# Patient Record
Sex: Female | Born: 1955
Health system: Southern US, Community
[De-identification: ages and names within clinical notes are randomized; demographics above are authoritative.]

## PROBLEM LIST (undated history)

## (undated) DIAGNOSIS — K439 Ventral hernia without obstruction or gangrene: Secondary | ICD-10-CM

## (undated) DIAGNOSIS — F1210 Cannabis abuse, uncomplicated: Secondary | ICD-10-CM

## (undated) DIAGNOSIS — K8020 Calculus of gallbladder without cholecystitis without obstruction: Secondary | ICD-10-CM

## (undated) DIAGNOSIS — I10 Essential (primary) hypertension: Secondary | ICD-10-CM

## (undated) DIAGNOSIS — N182 Chronic kidney disease, stage 2 (mild): Secondary | ICD-10-CM

## (undated) HISTORY — PX: APPENDECTOMY: SHX54

---

## 2012-09-14 ENCOUNTER — Emergency Department (HOSPITAL_COMMUNITY): Payer: Self-pay

## 2012-09-14 ENCOUNTER — Encounter (HOSPITAL_COMMUNITY): Payer: Self-pay | Admitting: *Deleted

## 2012-09-14 ENCOUNTER — Emergency Department (HOSPITAL_COMMUNITY)
Admission: EM | Admit: 2012-09-14 | Discharge: 2012-09-14 | Disposition: A | Discharge status: Home / Self Care | Payer: Self-pay | Attending: Emergency Medicine | Admitting: Emergency Medicine

## 2012-09-14 DIAGNOSIS — L84 Corns and callosities: Secondary | ICD-10-CM | POA: Insufficient documentation

## 2012-09-14 DIAGNOSIS — K439 Ventral hernia without obstruction or gangrene: Secondary | ICD-10-CM | POA: Insufficient documentation

## 2012-09-14 DIAGNOSIS — K8020 Calculus of gallbladder without cholecystitis without obstruction: Secondary | ICD-10-CM | POA: Insufficient documentation

## 2012-09-14 DIAGNOSIS — F17200 Nicotine dependence, unspecified, uncomplicated: Secondary | ICD-10-CM | POA: Insufficient documentation

## 2012-09-14 LAB — URINALYSIS, ROUTINE W REFLEX MICROSCOPIC
Nitrite: NEGATIVE
Protein, ur: NEGATIVE mg/dL
Specific Gravity, Urine: 1.024 (ref 1.005–1.030)
Urobilinogen, UA: 1 mg/dL (ref 0.0–1.0)

## 2012-09-14 LAB — CBC WITH DIFFERENTIAL/PLATELET
Basophils Relative: 0 % (ref 0–1)
Eosinophils Absolute: 0.2 10*3/uL (ref 0.0–0.7)
Hemoglobin: 14.1 g/dL (ref 12.0–15.0)
MCH: 33.3 pg (ref 26.0–34.0)
MCHC: 33.8 g/dL (ref 30.0–36.0)
Monocytes Relative: 7 % (ref 3–12)
Neutro Abs: 3.2 10*3/uL (ref 1.7–7.7)
Neutrophils Relative %: 50 % (ref 43–77)
Platelets: 213 10*3/uL (ref 150–400)
RBC: 4.24 MIL/uL (ref 3.87–5.11)

## 2012-09-14 LAB — RAPID URINE DRUG SCREEN, HOSP PERFORMED
Cocaine: NOT DETECTED
Opiates: NOT DETECTED

## 2012-09-14 LAB — COMPREHENSIVE METABOLIC PANEL
ALT: 28 U/L (ref 0–35)
AST: 34 U/L (ref 0–37)
CO2: 26 mEq/L (ref 19–32)
Calcium: 9.3 mg/dL (ref 8.4–10.5)
Chloride: 104 mEq/L (ref 96–112)
Creatinine, Ser: 0.92 mg/dL (ref 0.50–1.10)
GFR calc Af Amer: 79 mL/min — ABNORMAL LOW (ref >90)
GFR calc non Af Amer: 68 mL/min — ABNORMAL LOW (ref >90)
Glucose, Bld: 82 mg/dL (ref 70–99)
Total Bilirubin: 0.4 mg/dL (ref 0.3–1.2)

## 2012-09-14 LAB — URINE MICROSCOPIC-ADD ON

## 2012-09-14 MED ORDER — HYDROCODONE-ACETAMINOPHEN 5-325 MG PO TABS: 1.0000 | ORAL_TABLET | Freq: Four times a day (QID) | ORAL | Status: DC | PRN

## 2012-09-14 MED ORDER — SODIUM CHLORIDE 0.9 % IV BOLUS (SEPSIS)
500.0000 mL | Freq: Once | INTRAVENOUS | Status: AC
  Administered 2012-09-14: 500 mL via INTRAVENOUS

## 2012-09-14 MED ORDER — SODIUM CHLORIDE 0.9 % IV SOLN
INTRAVENOUS | Status: DC
  Administered 2012-09-14: 16:00:00 via INTRAVENOUS

## 2012-09-14 MED ORDER — IOHEXOL 300 MG/ML  SOLN
100.0000 mL | Freq: Once | INTRAMUSCULAR | Status: AC | PRN
  Administered 2012-09-14: 100 mL via INTRAVENOUS

## 2012-09-14 NOTE — ED Provider Notes (Signed)
History     CSN: [id number]  Arrival date & time 09/14/12  1120   First MD Initiated Contact with Patient 09/14/12 1210      Chief Complaint  Patient presents with  . Abdominal Pain  . Wound Check    (Consider location/radiation/quality/duration/timing/severity/associated sxs/prior treatment) HPI Comments: Tracey Ramos is a 56 y.o. Female who is here at the insistence of her daughter, to be evaluated, for weight loss, and abdominal night, and a sore on her right heel. The patient notices not in her stomach, which is painful, when she walks to work. She is not having nausea, or vomiting. She has lost 20 or 30 pounds in the last several months. She also has alopecia. She denies pain chest, back, legs, or head. She has no paresthesias, weakness, nausea, vomiting. There's been no fever, or chills. There are no other aggravating factors. No palliative factors.  Patient is a 56 y.o. female presenting with abdominal pain and wound check. The history is provided by the patient.  Abdominal Pain The primary symptoms of the illness include abdominal pain.  Wound Check     History reviewed. No pertinent past medical history.  History reviewed. No pertinent past surgical history.  History reviewed. No pertinent family history.  History  Substance Use Topics  . Smoking status: Current Every Day Smoker    Types: Cigarettes  . Smokeless tobacco: Not on file  . Alcohol Use: Yes    OB History    Grav Para Term Preterm Abortions TAB SAB Ect Mult Living                  Review of Systems  Gastrointestinal: Positive for abdominal pain.  All other systems reviewed and are negative.    Allergies  Penicillins  Home Medications   Current Outpatient Rx  Name Route Sig Dispense Refill  . ADULT MULTIVITAMIN W/MINERALS CH Oral Take 1 tablet by mouth daily.    Marland Kitchen HYDROCODONE-ACETAMINOPHEN 5-325 MG PO TABS Oral Take 1 tablet by mouth every 6 (six) hours as needed for pain. 30 tablet 0      BP 154/80  Pulse 71  Temp 98.1 F (36.7 C) (Oral)  Resp 15  SpO2 100%  Physical Exam  Nursing note and vitals reviewed. Constitutional: She is oriented to person, place, and time. She appears well-developed. No distress.       She is undernourished.  HENT:  Head: Normocephalic and atraumatic.  Eyes: Conjunctivae normal and EOM are normal. Pupils are equal, round, and reactive to light.  Neck: Normal range of motion and phonation normal. Neck supple.  Cardiovascular: Normal rate, regular rhythm and intact distal pulses.   Pulmonary/Chest: Effort normal and breath sounds normal. She exhibits no tenderness.  Abdominal: Soft. She exhibits no distension. There is no tenderness. There is no guarding.       Reducible midline hernia above the umbilicus. It is not tender during this procedure.   Musculoskeletal: Normal range of motion.  Neurological: She is alert and oriented to person, place, and time. She has normal strength. She exhibits normal muscle tone.  Skin: Skin is warm and dry.       Callus right heel. No associated swelling, drainage, or fluctuance  Psychiatric: She has a normal mood and affect. Her behavior is normal. Judgment and thought content normal.    ED Course  Procedures (including critical care time)  Labs Reviewed  COMPREHENSIVE METABOLIC PANEL - Abnormal; Notable for the following:    GFR calc  non Af Amer 68 (*)     GFR calc Af Amer 79 (*)     All other components within normal limits  URINALYSIS, ROUTINE W REFLEX MICROSCOPIC - Abnormal; Notable for the following:    Leukocytes, UA MODERATE (*)     All other components within normal limits  URINE RAPID DRUG SCREEN (HOSP PERFORMED) - Abnormal; Notable for the following:    Tetrahydrocannabinol POSITIVE (*)     All other components within normal limits  CBC WITH DIFFERENTIAL  ETHANOL  URINE MICROSCOPIC-ADD ON  LAB REPORT - SCANNED   No results found.   1. Abdominal wall hernia   2. Cholelithiasis    3. Foot callus       MDM  Nonspecific abdominal pain, with hernia, consistent with a mass that she palpates. The hernia, is reducible, and not a surgical urgency at this time. She is incidental cholelithiasis without cholecystitis. Liver function tests are normal. The patient stable for discharge with outpatient management. Doubt metabolic instability, serious bacterial infection or impending vascular collapse; the patient is stable for discharge.            Flint Melter, MD 09/17/12 1415

## 2012-09-14 NOTE — ED Notes (Signed)
Pt to CT

## 2012-09-14 NOTE — ED Notes (Signed)
Pt states "I have a knot in my stomach that moves." pt denies pain but family member is concerned. Pt also reports hair falling out and a callous appearing wound to back of right heal.

## 2012-09-14 NOTE — ED Notes (Signed)
Pt gave urine sample if needed. 

## 2012-09-14 NOTE — ED Notes (Signed)
Pt has multiple complaints. Reports extreme hair loss within the last month, callous on her right heel, and knot on her abdomen. Knot appeared like a hernia with no pain right now but does have pain at time, and movable sometimes. Bowel sound present, alert, oriented.

## 2012-09-14 NOTE — ED Notes (Signed)
MD at bedside. 

## 2012-09-26 ENCOUNTER — Ambulatory Visit (INDEPENDENT_AMBULATORY_CARE_PROVIDER_SITE_OTHER): Payer: Self-pay | Admitting: General Surgery

## 2012-10-13 ENCOUNTER — Ambulatory Visit (INDEPENDENT_AMBULATORY_CARE_PROVIDER_SITE_OTHER): Payer: Self-pay | Admitting: General Surgery

## 2012-10-24 ENCOUNTER — Ambulatory Visit: Payer: Self-pay | Admitting: Family Medicine

## 2012-11-17 ENCOUNTER — Ambulatory Visit: Payer: Self-pay | Admitting: Family Medicine

## 2014-05-11 ENCOUNTER — Encounter (HOSPITAL_COMMUNITY): Payer: Self-pay | Admitting: Emergency Medicine

## 2014-05-11 ENCOUNTER — Emergency Department (HOSPITAL_COMMUNITY)
Admission: EM | Admit: 2014-05-11 | Discharge: 2014-05-12 | Disposition: A | Discharge status: Home / Self Care | Payer: Self-pay | Attending: Emergency Medicine | Admitting: Emergency Medicine

## 2014-05-11 ENCOUNTER — Emergency Department (HOSPITAL_COMMUNITY): Payer: Self-pay

## 2014-05-11 DIAGNOSIS — F17200 Nicotine dependence, unspecified, uncomplicated: Secondary | ICD-10-CM | POA: Insufficient documentation

## 2014-05-11 DIAGNOSIS — Z880 Allergy status to penicillin: Secondary | ICD-10-CM | POA: Insufficient documentation

## 2014-05-11 DIAGNOSIS — E876 Hypokalemia: Secondary | ICD-10-CM

## 2014-05-11 DIAGNOSIS — I4892 Unspecified atrial flutter: Secondary | ICD-10-CM | POA: Insufficient documentation

## 2014-05-11 DIAGNOSIS — Z8719 Personal history of other diseases of the digestive system: Secondary | ICD-10-CM | POA: Insufficient documentation

## 2014-05-11 DIAGNOSIS — R51 Headache: Secondary | ICD-10-CM | POA: Insufficient documentation

## 2014-05-11 DIAGNOSIS — R000 Tachycardia, unspecified: Secondary | ICD-10-CM | POA: Insufficient documentation

## 2014-05-11 DIAGNOSIS — R0602 Shortness of breath: Secondary | ICD-10-CM | POA: Insufficient documentation

## 2014-05-11 HISTORY — DX: Calculus of gallbladder without cholecystitis without obstruction: K80.20

## 2014-05-11 HISTORY — DX: Ventral hernia without obstruction or gangrene: K43.9

## 2014-05-11 LAB — CBC WITH DIFFERENTIAL/PLATELET
BASOS ABS: 0 10*3/uL (ref 0.0–0.1)
BASOS PCT: 0 % (ref 0–1)
EOS PCT: 0 % (ref 0–5)
Eosinophils Absolute: 0 10*3/uL (ref 0.0–0.7)
HEMATOCRIT: 41.1 % (ref 36.0–46.0)
Hemoglobin: 14.4 g/dL (ref 12.0–15.0)
Lymphocytes Relative: 24 % (ref 12–46)
Lymphs Abs: 3.3 10*3/uL (ref 0.7–4.0)
MCH: 33.1 pg (ref 26.0–34.0)
MCHC: 35 g/dL (ref 30.0–36.0)
MCV: 94.5 fL (ref 78.0–100.0)
MONO ABS: 1.4 10*3/uL — AB (ref 0.1–1.0)
MONOS PCT: 10 % (ref 3–12)
Neutro Abs: 9.1 10*3/uL — ABNORMAL HIGH (ref 1.7–7.7)
Neutrophils Relative %: 66 % (ref 43–77)
Platelets: 231 10*3/uL (ref 150–400)
RBC: 4.35 MIL/uL (ref 3.87–5.11)
RDW: 12.6 % (ref 11.5–15.5)
WBC: 13.8 10*3/uL — ABNORMAL HIGH (ref 4.0–10.5)

## 2014-05-11 LAB — RAPID URINE DRUG SCREEN, HOSP PERFORMED
Amphetamines: NOT DETECTED
BARBITURATES: NOT DETECTED
Benzodiazepines: NOT DETECTED
COCAINE: NOT DETECTED
Opiates: NOT DETECTED
TETRAHYDROCANNABINOL: NOT DETECTED

## 2014-05-11 LAB — COMPREHENSIVE METABOLIC PANEL
ALT: 13 U/L (ref 0–35)
AST: 26 U/L (ref 0–37)
Albumin: 5 g/dL (ref 3.5–5.2)
Alkaline Phosphatase: 83 U/L (ref 39–117)
BUN: 11 mg/dL (ref 6–23)
CALCIUM: 10.3 mg/dL (ref 8.4–10.5)
CO2: 19 meq/L (ref 19–32)
CREATININE: 1.33 mg/dL — AB (ref 0.50–1.10)
Chloride: 101 mEq/L (ref 96–112)
GFR calc Af Amer: 50 mL/min — ABNORMAL LOW (ref >90)
GFR, EST NON AFRICAN AMERICAN: 43 mL/min — AB (ref >90)
Glucose, Bld: 145 mg/dL — ABNORMAL HIGH (ref 70–99)
Potassium: 3 mEq/L — ABNORMAL LOW (ref 3.7–5.3)
Sodium: 142 mEq/L (ref 137–147)
Total Bilirubin: 0.9 mg/dL (ref 0.3–1.2)
Total Protein: 8.5 g/dL — ABNORMAL HIGH (ref 6.0–8.3)

## 2014-05-11 LAB — MAGNESIUM: MAGNESIUM: 2.1 mg/dL (ref 1.5–2.5)

## 2014-05-11 LAB — I-STAT TROPONIN, ED: TROPONIN I, POC: 0 ng/mL (ref 0.00–0.08)

## 2014-05-11 LAB — PHOSPHORUS: Phosphorus: 2.7 mg/dL (ref 2.3–4.6)

## 2014-05-11 LAB — PRO B NATRIURETIC PEPTIDE: PRO B NATRI PEPTIDE: 108.6 pg/mL (ref 0–125)

## 2014-05-11 MED ORDER — POTASSIUM CHLORIDE CRYS ER 20 MEQ PO TBCR
40.0000 meq | EXTENDED_RELEASE_TABLET | Freq: Once | ORAL | Status: AC
  Administered 2014-05-12: 40 meq via ORAL
  Filled 2014-05-11: qty 2

## 2014-05-11 MED ORDER — ADENOSINE 6 MG/2ML IV SOLN
6.0000 mg | Freq: Once | INTRAVENOUS | Status: DC
  Filled 2014-05-11: qty 2

## 2014-05-11 MED ORDER — ONDANSETRON HCL 4 MG/2ML IJ SOLN
INTRAMUSCULAR | Status: AC
  Administered 2014-05-11: 4 mg via INTRAVENOUS
  Filled 2014-05-11: qty 2

## 2014-05-11 MED ORDER — DILTIAZEM HCL 25 MG/5ML IV SOLN
15.0000 mg | Freq: Once | INTRAVENOUS | Status: AC
  Administered 2014-05-11: 15 mg via INTRAVENOUS
  Filled 2014-05-11: qty 5

## 2014-05-11 MED ORDER — ADENOSINE 6 MG/2ML IV SOLN
6.0000 mg | Freq: Once | INTRAVENOUS | Status: AC
  Administered 2014-05-11: 6 mg via INTRAVENOUS

## 2014-05-11 MED ORDER — DEXTROSE 5 % IV SOLN
5.0000 mg/h | INTRAVENOUS | Status: DC
  Administered 2014-05-11: 5 mg/h via INTRAVENOUS

## 2014-05-11 MED ORDER — ADENOSINE 6 MG/2ML IV SOLN: 18.0000 mg | Freq: Once | INTRAVENOUS | Status: DC

## 2014-05-11 MED ORDER — ONDANSETRON HCL 4 MG/2ML IJ SOLN
4.0000 mg | Freq: Once | INTRAMUSCULAR | Status: AC
  Administered 2014-05-11: 4 mg via INTRAVENOUS

## 2014-05-11 NOTE — ED Notes (Addendum)
Pt. presents with boyfriend , reports palpitations with headache onset this evening , pt. is a poor historian , respirations unlabored , no facial asymmetry/ equal grips , speech clear . Hypertensive at triage / HR = 170's .

## 2014-05-11 NOTE — ED Notes (Signed)
EKG repeated, NSR 94, [facility] alert, NAD, calm, (denies: sob, pain or other sx).

## 2014-05-11 NOTE — ED Provider Notes (Signed)
CSN: [id number]     Arrival date & time 05/11/14  2114 History   First MD Initiated Contact with Patient 05/11/14 2133     Chief Complaint  Patient presents with  . Palpitations  . Headache     (Consider location/radiation/quality/duration/timing/severity/associated sxs/prior Treatment) Patient is a 58 y.o. female presenting with palpitations and headaches. The history is provided by the patient and medical records. No language interpreter was used.  Palpitations Palpitations quality:  Fast Onset quality:  Sudden Timing:  Constant Progression:  Worsening Chronicity:  New Context: not anxiety, not appetite suppressants, not illicit drugs, not nicotine and not stimulant use   Relieved by:  Nothing Ineffective treatments:  Bed rest Associated symptoms: shortness of breath   Associated symptoms: no back pain, no chest pain, no leg pain, no numbness, no vomiting and no weakness   Risk factors: no diabetes mellitus, no heart disease, no hx of atrial fibrillation, no hx of DVT, no hx of PE, no hx of thyroid disease, no hyperthyroidism and no stress   Headache Associated symptoms: no back pain, no numbness, no vomiting and no weakness     Past Medical History  Diagnosis Date  . Cholelithiasis   . Hernia of abdominal wall    History reviewed. No pertinent past surgical history. No family history on file. History  Substance Use Topics  . Smoking status: Current Every Day Smoker    Types: Cigarettes  . Smokeless tobacco: Not on file  . Alcohol Use: Yes   OB History   Grav Para Term Preterm Abortions TAB SAB Ect Mult Living                 Review of Systems  Respiratory: Positive for shortness of breath.   Cardiovascular: Positive for palpitations. Negative for chest pain.  Gastrointestinal: Negative for vomiting.  Musculoskeletal: Negative for back pain.  Neurological: Positive for headaches. Negative for numbness.  All other systems reviewed and are  negative.     Allergies  Penicillins  Home Medications   Prior to Admission medications   Medication Sig Start Date End Date Taking? Authorizing Provider  HYDROcodone-acetaminophen (NORCO) 5-325 MG per tablet Take 1 tablet by mouth every 6 (six) hours as needed for pain. 09/14/12   Flint MelterElliott L Wentz, MD  Multiple Vitamin (MULTIVITAMIN WITH MINERALS) TABS Take 1 tablet by mouth daily.    Historical Provider, MD   BP 195/115  Pulse 174  Temp(Src) 98.5 F (36.9 C)  Resp 20  SpO2 100% Physical Exam  Nursing note and vitals reviewed. Constitutional: She is oriented to person, place, and time. She appears well-developed and well-nourished.  HENT:  Head: Normocephalic and atraumatic.  Right Ear: External ear normal.  Left Ear: External ear normal.  Nose: Nose normal.  Eyes: Conjunctivae are normal. Pupils are equal, round, and reactive to light.  Neck: Normal range of motion. Neck supple.  Cardiovascular: Regular rhythm.  Tachycardia present.   Pulmonary/Chest: Effort normal and breath sounds normal.  Abdominal: Soft. Bowel sounds are normal.  Musculoskeletal: Normal range of motion. She exhibits no edema and no tenderness.  Neurological: She is alert and oriented to person, place, and time. She has normal reflexes.  Skin: Skin is warm and dry.  Psychiatric: She has a normal mood and affect.    ED Course  Procedures (including critical care time) Labs Review Labs Reviewed  CBC WITH DIFFERENTIAL - Abnormal; Notable for the following:    WBC 13.8 (*)    Neutro Abs 9.1 (*)  Monocytes Absolute 1.4 (*)    All other components within normal limits  COMPREHENSIVE METABOLIC PANEL - Abnormal; Notable for the following:    Potassium 3.0 (*)    Glucose, Bld 145 (*)    Creatinine, Ser 1.33 (*)    Total Protein 8.5 (*)    GFR calc non Af Amer 43 (*)    GFR calc Af Amer 50 (*)    All other components within normal limits  MAGNESIUM  PHOSPHORUS  URINE RAPID DRUG SCREEN (HOSP  PERFORMED)  PRO B NATRIURETIC PEPTIDE  ETHANOL  I-STAT TROPOININ, ED    Imaging Review Dg Chest Portable 1 View  05/11/2014   CLINICAL DATA:  Palpitations  EXAM: PORTABLE CHEST - 1 VIEW  COMPARISON:  None.  FINDINGS: The lungs are adequately inflated. There are external pacemaker pads present. The cardiopericardial silhouette is normal in size. The pulmonary vascularity is not engorged. The mediastinum is normal in width. An electronic the circuit board type device is visible to the right of the lower thoracic spine. There is no pleural effusion. The observed portions of the bony thorax are normal.  IMPRESSION: There is no evidence of CHF or pneumonia nor other active cardiopulmonary disease.   Electronically Signed   By: David  SwazilandJordan   On: 05/11/2014 22:24     EKG Interpretation   Date/Time:  Friday May 11 2014 21:20:32 EDT Ventricular Rate:  175 PR Interval:  122 QRS Duration: 66 QT Interval:  280 QTC Calculation: 477 R Axis:   7 Text Interpretation:  Sinus tachycardia Marked ST abnormality, possible  inferior subendocardial injury Abnormal ECG narrow rhythym tachycardia -  aflutter vs st Abnormal ekg Confirmed by Gerhard MunchLOCKWOOD, ROBERT  MD [phone]([id number]) on  05/11/2014 9:40:06 PM        Date: 05/11/2014  Rate: 175  Rhythm: a flutter  QRS Axis: normal  Intervals: normal  ST/T Wave abnormalities: ST depressions laterally  Conduction Disutrbances:none  Narrative Interpretation:   Old EKG Reviewed: none available      Date: 05/12/2014  Rate: 97  Rhythm: normal sinus rhythm  QRS Axis: normal  Intervals: normal  ST/T Wave abnormalities: normal  Conduction Disutrbances:none  Narrative Interpretation:   Old EKG Reviewed: sinus rhythm has replaced atrial flutter    MDM   Final diagnoses:  Atrial flutter  Palpitation  SOB (shortness of breath)    Patient presents emergency Department with complaints of palpitations and shortness of breath. Upon arrival her vital signs  remarkable for heart rate in the 170s. EKG completed showed evidence of possible SVT versus a flutter. 6 mg of adenosine was given. Rhythm strip shows evidence of flutter waves. She was given diltiazem bolus and started on a drip. Workup for underlying cause remarkable for mild hypokalemia but unremarkable CXR, troponin, or UDS.  Patient given PO potassium and cardiology consulted for admission.  While on dilt gtt patient noted to be in sinus and EKG completed confirmed this.  Patient to be admitted for initiation of PO medications and monitoring.      Johnney Ouerek Zakaree Mcclenahan, MD 05/12/14 [phone]

## 2014-05-11 NOTE — ED Notes (Signed)
Drs Ronald LoboKunz and Jeraldine [facility]Lockwood [facility] No significant lasting effect from adenosine, slowed and returned to 134, cardizem ordered, pt tolerated adenosine well, remains alert, NAD, calm, interactive, resps e/u, some increased wob and sighing, CP resolved, denies dizziness or nausea, reports some sob.

## 2014-05-12 DIAGNOSIS — I495 Sick sinus syndrome: Secondary | ICD-10-CM

## 2014-05-12 LAB — URINALYSIS, ROUTINE W REFLEX MICROSCOPIC
Bilirubin Urine: NEGATIVE
GLUCOSE, UA: NEGATIVE mg/dL
Hgb urine dipstick: NEGATIVE
Ketones, ur: NEGATIVE mg/dL
Leukocytes, UA: NEGATIVE
NITRITE: NEGATIVE
Protein, ur: NEGATIVE mg/dL
SPECIFIC GRAVITY, URINE: 1.009 (ref 1.005–1.030)
Urobilinogen, UA: 0.2 mg/dL (ref 0.0–1.0)
pH: 7 (ref 5.0–8.0)

## 2014-05-12 LAB — ETHANOL

## 2014-05-12 NOTE — ED Notes (Signed)
Dr. Lavella LemonsManly into room to explain d/c and update on plan.

## 2014-05-12 NOTE — ED Notes (Signed)
[facility]iology[facility] changes.

## 2014-05-12 NOTE — Discharge Instructions (Signed)
Hypokalemia Hypokalemia means that the amount of potassium in the blood is lower than normal.Potassium is a chemical, called an electrolyte, that helps regulate the amount of fluid in the body. It also stimulates muscle contraction and helps nerves function properly.Most of the body's potassium is inside of cells, and only a very small amount is in the blood. Because the amount in the blood is so small, minor changes can be life-threatening. CAUSES  Antibiotics.  Diarrhea or vomiting.  Using laxatives too much, which can cause diarrhea.  Chronic kidney disease.  Water pills (diuretics).  Eating disorders (bulimia).  Low magnesium level.  Sweating a lot. SIGNS AND SYMPTOMS  Weakness.  Constipation.  Fatigue.  Muscle cramps.  Mental confusion.  Skipped heartbeats or irregular heartbeat (palpitations).  Tingling or numbness. DIAGNOSIS  Your health care provider can diagnose hypokalemia with blood tests. In addition to checking your potassium level, your health care provider may also check other lab tests. TREATMENT Hypokalemia can be treated with potassium supplements taken by mouth or adjustments in your current medicines. If your potassium level is very low, you may need to get potassium through a vein (IV) and be monitored in the hospital. A diet high in potassium is also helpful. Foods high in potassium are:  Nuts, such as peanuts and pistachios.  Seeds, such as sunflower seeds and pumpkin seeds.  Peas, lentils, and lima beans.  Whole grain and bran cereals and breads.  Fresh fruit and vegetables, such as apricots, avocado, bananas, cantaloupe, kiwi, oranges, tomatoes, asparagus, and potatoes.  Orange and tomato juices.  Red meats.  Fruit yogurt. HOME CARE INSTRUCTIONS  Take all medicines as prescribed by your health care provider.  Maintain a healthy diet by including nutritious food, such as fruits, vegetables, nuts, whole grains, and lean meats.  If  you are taking a laxative, be sure to follow the directions on the label. SEEK MEDICAL CARE IF:  Your weakness gets worse.  You feel your heart pounding or racing.  You are vomiting or having diarrhea.  You are diabetic and having trouble keeping your blood glucose in the normal range. SEEK IMMEDIATE MEDICAL CARE IF:  You have chest pain, shortness of breath, or dizziness.  You are vomiting or having diarrhea for more than 2 days.  You faint. MAKE SURE YOU:   Understand these instructions.  Will watch your condition.  Will get help right away if you are not doing well or get worse. Document Released: 12/14/2005 Document Revised: 10/04/2013 Document Reviewed: 06/16/2013 ExitCare Patient Information 2014 [employer].  

## 2014-05-12 NOTE — ED Notes (Signed)
[facility], pt updated, pending disposition, possible plan to d/c. No changes, VSS, denies pain or sx.

## 2014-05-12 NOTE — Consult Note (Signed)
History and Physical  Patient ID: Tracey Ramos MRN: [id number], SOB: 10/12/1956 58 y.o. Date of Encounter: 05/12/2014, 12:53 AM  Primary Physician: No primary provider on file. Primary Cardiologist: none  Chief Complaint: headache and palpitations  HPI: 58 y.o. female w/ no cardiac PMHx  who [facility] with complaints of headache and palpitations. Occurred after waking from a nap this evening. Had severe headache and felt her chest pounding. Felt lightheaded. No syncope. Denies ever having this before. Previously reports being healthy. No recent illness.   Denies illicit drugs. Occasional ETOH, none recently. Occasional tobacco. Denies caffeine. Denies OTC meds.   Difficult day at work- was outside all day, hot, graduation at [employer].  Upon arrival in triage, heart rates in the 140s to 170s, narrow complex. By ER, thought to be in SVT and given adenosine. Strip demonstrates complete AV block with sinus tach at ~140 with return of AV conduction in the 120s. Started on diltiazem gtt for presumed atrial flutter. On telemetry, gradual change from 140s to 90s.  First EKG appears to be sinus tach at 170. Follow up EKG with sinus, nl axis, no ST or TW changes. CXR was without acute cardiopulmonary abnormalities. Labs are significant for K of 3.0 and Cr. Of 1.3, WBC of 14.   Past Medical History  Diagnosis Date  . Cholelithiasis   . Hernia of abdominal wall      Surgical History: History reviewed. No pertinent past surgical history.   Home Meds: Prior to Admission medications   Medication Sig Start Date End Date Taking? Authorizing Provider  Multiple Vitamin (MULTIVITAMIN WITH MINERALS) TABS tablet Take 1 tablet by mouth daily.   Yes Historical Provider, MD  tetrahydrozoline 0.05 % ophthalmic solution Place 1 drop into both eyes daily as needed (for dry eyes).   Yes Historical Provider, MD    Allergies:  Allergies  Allergen Reactions  . Penicillins  Swelling    History   Social History  . Marital Status: Single    Spouse Name: N/A    Number of Children: N/A  . Years of Education: N/A   Occupational History  . Not on file.   Social History Main Topics  . Smoking status: Current Every Day Smoker    Types: Cigarettes  . Smokeless tobacco: Not on file  . Alcohol Use: Yes  . Drug Use: No  . Sexual Activity: Not on file   Other Topics Concern  . Not on file   Social History Narrative  . No narrative on file     No family history on file.  Review of Systems: General: negative for chills, fever, night sweats or weight changes.  Cardiovascular: see HPI Dermatological: negative for rash Respiratory: negative for cough or wheezing Urologic: negative for hematuria Abdominal: negative for nausea, vomiting, diarrhea, bright red blood per rectum, melena, or hematemesis Neurologic: negative for visual changes, syncope, or dizziness All other systems reviewed and are otherwise negative except as noted above.  Labs:   Lab Results  Component Value Date   WBC 13.8* 05/11/2014   HGB 14.4 05/11/2014   HCT 41.1 05/11/2014   MCV 94.5 05/11/2014   PLT 231 05/11/2014    Recent Labs Lab 05/11/14 2132  NA 142  K 3.0*  CL 101  CO2 19  BUN 11  CREATININE 1.33*  CALCIUM 10.3  PROT 8.5*  BILITOT 0.9  ALKPHOS 83  ALT 13  AST 26  GLUCOSE 145*   No results found for  this basename: CKTOTAL, CKMB, TROPONINI,  in the last 72 hours No results found for this basename: CHOL, HDL, LDLCALC, TRIG   No results found for this basename: DDIMER    Radiology/Studies:  Dg Chest Portable 1 View  05/11/2014   CLINICAL DATA:  Palpitations  EXAM: PORTABLE CHEST - 1 VIEW  COMPARISON:  None.  FINDINGS: The lungs are adequately inflated. There are external pacemaker pads present. The cardiopericardial silhouette is normal in size. The pulmonary vascularity is not engorged. The mediastinum is normal in width. An electronic the circuit board type  device is visible to the right of the lower thoracic spine. There is no pleural effusion. The observed portions of the bony thorax are normal.  IMPRESSION: There is no evidence of CHF or pneumonia nor other active cardiopulmonary disease.   Electronically Signed   By: David  Swazil[place]Jordan   On: 05/11/2014 22:24     EKG: see HPI  Physical Exam: Blood pressure 154/94, pulse 97, temperature 98.5 F (36.9 C), resp. rate 17, SpO2 99.00%. General: Well developed, well nourished, in no acute distress. Odd affect Head: Normocephalic, atraumatic, sclera non-icteric, nares are without discharge Neck: Supple. Negative for carotid bruits. JVD not elevated. Lungs: Clear bilaterally to auscultation without wheezes, rales, or rhonchi. Breathing is unlabored. Heart: RRR with S1 S2. No murmurs, rubs, or gallops appreciated. Abdomen: Soft, non-tender, non-distended with normoactive bowel sounds. No rebound/guarding. No obvious abdominal masses. Msk:  Strength and tone appear normal for age. Extremities: No edema. No clubbing or cyanosis. Distal pedal pulses are 2+ and equal bilaterally. Neuro: Alert and oriented X 3. Moves all extremities spontaneously. Psych:  Responds to questions appropriately though with odd affect.    ASSESSMENT AND PLAN:  1. Sinus tachycardia and palpitations 2. Headache 3. Elevated blood pressure  58 y.o. female w/ no cardiac PMHx  who [facility] with complaints of headache and palpitations. Found to be in fast narrow complex rhythm that was billed to me as atrial flutter. However, in reviewing the strips, response to adenosine and telemetry, her presentation is more consistent with sinus tachycardia and NOT atrial flutter. Cause of her sinus tachycardia is unclear though maybe related to headache, anxiety and pain. Negative troponin.  Noted elevated Cr, protein --> likely mild dehydration contributing to sinus tachycardia. Mildly elevated WBC, but  afebrile and not localizing.  Elevated BP --> needs PCP evaluation and followup.  Headache resolved.   No cardiac indications for admission.   Thank you for this consult. Please call with questions. No need for cardiac followup but emphasized that she needs a PCP.   Signed, Ardis RowanMatthew Rheanna Sergent MD 05/12/2014, 12:53 AM

## 2014-05-12 NOTE — ED Provider Notes (Signed)
Patient has been evaluated by Dr. Adolm JosephWhitlock of Cardiology. He has reviewed patient's EKGs and rhythm strips. The patient was treated with adenosine for suspected SVT which, appears to have successfully converted her to sinus rhythm - as demonstrated on EKGs/rythm strips. Dr. Adolm JosephWhitlock does not believe that the patient has any acute cardiac issues or evidence of dysrrythmia other than sinus tach and SVT. No signs of atrial flutter/fib.   Patient noticed to be hypokalemic on exam and this has been treated. Pt has received IVF. She is noted to have WBC of 13,000k. CXR wnl. I have ordered U/A to assess for occult UTI as possible cause of leukocytosis.   Brandt LoosenJulie Manly, MD 05/29/14 [phone]

## 2014-05-12 NOTE — ED Provider Notes (Signed)
This patient was seen in conjunction with the resident physician, Dr. Ronald LoboKunz.  That the patient on arrival to 2 tachycardia, dyspnea, mild lightheadedness. Clearly, the patient's tachycardia was evaluated, found to be regular, narrow complex, with concern for atrial flutter versus SVT.  On monitor: hr -140-190 - regular tachycardic, abnormal  O2- [place], 99% abormal  Following 6mg   The pause allowed visualization of p waves c/w flutter.  Patient was then started on Cardizem drip with decrease in HR, and eventual conversion to SR.  I have seen the ECG (1 and 2) and agree with the interpretation.    CRITICAL CARE Performed by: Gerhard Munchobert Brae Gartman Total critical care time: 35 Critical care time was exclusive of separately billable procedures and treating other patients. Critical care was necessary to treat or prevent imminent or life-threatening deterioration. Critical care was time spent personally by me on the following activities: development of treatment plan with patient and/or surrogate as well as nursing, discussions with consultants, evaluation of patient's response to treatment, examination of patient, obtaining history from patient or surrogate, ordering and performing treatments and interventions, ordering and review of laboratory studies, ordering and review of radiographic studies, pulse oximetry and re-evaluation of patient's condition.   Gerhard Munchobert Jermane Brayboy, MD 05/12/14 [phone]

## 2016-05-26 ENCOUNTER — Emergency Department (HOSPITAL_COMMUNITY)
Admission: EM | Admit: 2016-05-26 | Discharge: 2016-05-26 | Disposition: A | Discharge status: Home / Self Care | Payer: Self-pay | Attending: Emergency Medicine | Admitting: Emergency Medicine

## 2016-05-26 ENCOUNTER — Emergency Department (HOSPITAL_COMMUNITY): Payer: Self-pay

## 2016-05-26 ENCOUNTER — Encounter (HOSPITAL_COMMUNITY): Payer: Self-pay

## 2016-05-26 DIAGNOSIS — Z87891 Personal history of nicotine dependence: Secondary | ICD-10-CM | POA: Insufficient documentation

## 2016-05-26 DIAGNOSIS — R000 Tachycardia, unspecified: Secondary | ICD-10-CM | POA: Insufficient documentation

## 2016-05-26 DIAGNOSIS — Z8719 Personal history of other diseases of the digestive system: Secondary | ICD-10-CM | POA: Insufficient documentation

## 2016-05-26 DIAGNOSIS — R002 Palpitations: Secondary | ICD-10-CM | POA: Insufficient documentation

## 2016-05-26 DIAGNOSIS — Z880 Allergy status to penicillin: Secondary | ICD-10-CM | POA: Insufficient documentation

## 2016-05-26 DIAGNOSIS — I10 Essential (primary) hypertension: Secondary | ICD-10-CM | POA: Insufficient documentation

## 2016-05-26 DIAGNOSIS — R0602 Shortness of breath: Secondary | ICD-10-CM | POA: Insufficient documentation

## 2016-05-26 HISTORY — DX: Essential (primary) hypertension: I10

## 2016-05-26 LAB — COMPREHENSIVE METABOLIC PANEL
ALT: 18 U/L (ref 14–54)
AST: 25 U/L (ref 15–41)
Albumin: 4.1 g/dL (ref 3.5–5.0)
Alkaline Phosphatase: 63 U/L (ref 38–126)
Anion gap: 10 (ref 5–15)
BILIRUBIN TOTAL: 1.1 mg/dL (ref 0.3–1.2)
BUN: 13 mg/dL (ref 6–20)
CHLORIDE: 106 mmol/L (ref 101–111)
CO2: 23 mmol/L (ref 22–32)
CREATININE: 1.1 mg/dL — AB (ref 0.44–1.00)
Calcium: 9.2 mg/dL (ref 8.9–10.3)
GFR, EST NON AFRICAN AMERICAN: 53 mL/min — AB (ref >60)
Glucose, Bld: 87 mg/dL (ref 65–99)
POTASSIUM: 3.7 mmol/L (ref 3.5–5.1)
Sodium: 139 mmol/L (ref 135–145)
TOTAL PROTEIN: 7.4 g/dL (ref 6.5–8.1)

## 2016-05-26 LAB — I-STAT TROPONIN, ED
TROPONIN I, POC: 0.01 ng/mL (ref 0.00–0.08)
Troponin i, poc: 0.01 ng/mL (ref 0.00–0.08)

## 2016-05-26 LAB — CBC
HEMATOCRIT: 40.9 % (ref 36.0–46.0)
HEMOGLOBIN: 13.5 g/dL (ref 12.0–15.0)
MCH: 30.7 pg (ref 26.0–34.0)
MCHC: 33 g/dL (ref 30.0–36.0)
MCV: 93 fL (ref 78.0–100.0)
Platelets: 210 10*3/uL (ref 150–400)
RBC: 4.4 MIL/uL (ref 3.87–5.11)
RDW: 13 % (ref 11.5–15.5)
WBC: 11.3 10*3/uL — ABNORMAL HIGH (ref 4.0–10.5)

## 2016-05-26 LAB — TSH: TSH: 1.908 u[IU]/mL (ref 0.350–4.500)

## 2016-05-26 LAB — D-DIMER, QUANTITATIVE (NOT AT ARMC): D-Dimer, Quant: 0.27 ug/mL-FEU (ref 0.00–0.50)

## 2016-05-26 MED ORDER — LORAZEPAM 2 MG/ML IJ SOLN
1.0000 mg | Freq: Once | INTRAMUSCULAR | Status: AC
  Administered 2016-05-26: 1 mg via INTRAVENOUS
  Filled 2016-05-26: qty 1

## 2016-05-26 MED ORDER — SODIUM CHLORIDE 0.9 % IV BOLUS (SEPSIS)
1000.0000 mL | Freq: Once | INTRAVENOUS | Status: AC
Start: 2016-05-26 — End: 2016-05-26
  Administered 2016-05-26: 1000 mL via INTRAVENOUS

## 2016-05-26 NOTE — ED Notes (Signed)
Pt transported to xray 

## 2016-05-26 NOTE — ED Notes (Signed)
Patient began having central chest pain that woke her up out of her sleep just prior to arriving at the ED. Pt admits to having shortness of breath and dizziness. Denies nausea and vomiting. Pt is A&Ox 4 in no apparent distress

## 2016-05-26 NOTE — ED Notes (Signed)
Pt placed back on monitor via bp, pulse oximetry, and 5-lead upon returning from x-ray

## 2016-05-26 NOTE — ED Provider Notes (Signed)
CSN: [id number]     Arrival date & time 05/26/16  0650 History   First MD Initiated Contact with Patient 05/26/16 0700     Chief Complaint  Patient presents with  . Chest Pain     (Consider location/radiation/quality/duration/timing/severity/associated sxs/prior Treatment) Patient is a 60 y.o. female presenting with chest pain.  Chest Pain Pain location:  L chest Pain quality: pressure, sharp and shooting   Pain radiates to:  Does not radiate Pain radiates to the back: no   Pain severity:  Moderate Onset quality:  Sudden Duration:  5 hours Timing:  Constant Progression:  Unchanged Chronicity:  Recurrent Relieved by:  Nothing Worsened by:  Nothing tried Ineffective treatments:  None tried Associated symptoms: palpitations and shortness of breath   Associated symptoms: no dizziness, no fever, no headache, no nausea and not vomiting     60 yo F With a chief complaint chest pain. This will grow from sleep. Pain is pinpoint does not radiate. Feels like a pressure. Associated with palpitations and shortness of breath. Nonexertional. Patient has had symptoms like this in the past. She is feeling quite a bit anxious. Denies heat sensitivity or weight loss. Denies fevers or chills cough or congestion. Denies PE risk factors.  Past Medical History  Diagnosis Date  . Cholelithiasis   . Hernia of abdominal wall   . Hypertension    History reviewed. No pertinent past surgical history. No family history on file. Social History  Substance Use Topics  . Smoking status: Former Smoker    Types: Cigarettes  . Smokeless tobacco: None  . Alcohol Use: Yes   OB History    No data available     Review of Systems  Constitutional: Negative for fever and chills.  HENT: Negative for congestion and rhinorrhea.   Eyes: Negative for redness and visual disturbance.  Respiratory: Positive for shortness of breath. Negative for wheezing.   Cardiovascular: Positive for chest pain and palpitations.   Gastrointestinal: Negative for nausea and vomiting.  Genitourinary: Negative for dysuria and urgency.  Musculoskeletal: Negative for myalgias and arthralgias.  Skin: Negative for pallor and wound.  Neurological: Negative for dizziness and headaches.      Allergies  Penicillins  Home Medications   Prior to Admission medications   Medication Sig Start Date End Date Taking? Authorizing Provider  tetrahydrozoline 0.05 % ophthalmic solution Place 1 drop into both eyes daily as needed (for dry eyes).   Yes Historical Provider, MD   BP 162/103 mmHg  Pulse 108  Temp(Src) 98.2 F (36.8 C) (Oral)  Resp 16  SpO2 100% Physical Exam  Constitutional: She is oriented to person, place, and time. She appears well-developed and well-nourished. No distress.  HENT:  Head: Normocephalic and atraumatic.  Eyes: EOM are normal. Pupils are equal, round, and reactive to light.  Neck: Normal range of motion. Neck supple.  Cardiovascular: Regular rhythm.  Tachycardia present.  Exam reveals no gallop and no friction rub.   No murmur heard. Pulmonary/Chest: Effort normal. She has no wheezes. She has no rales.  Abdominal: Soft. She exhibits no distension. There is no tenderness. There is no rebound and no guarding.  Musculoskeletal: She exhibits no edema or tenderness.  Neurological: She is alert and oriented to person, place, and time.  Skin: Skin is warm and dry. She is not diaphoretic.  Psychiatric: She has a normal mood and affect. Her behavior is normal.  Nursing note and vitals reviewed.   ED Course  Procedures (including critical care  time) Labs Review Labs Reviewed  CBC - Abnormal; Notable for the following:    WBC 11.3 (*)    All other components within normal limits  COMPREHENSIVE METABOLIC PANEL - Abnormal; Notable for the following:    Creatinine, Ser 1.10 (*)    GFR calc non Af Amer 53 (*)    All other components within normal limits  TSH  D-DIMER, QUANTITATIVE (NOT AT [facility])   I-STAT TROPOININ, ED  Rosezena SensorI-STAT TROPOININ, ED    Imaging Review Dg Chest 2 View  05/26/2016  CLINICAL DATA:  Woke up with shortness of breath this morning. EXAM: CHEST  2 VIEW COMPARISON:  05/11/2014 FINDINGS: Mild peribronchial thickening and prominent lung markings at the lung bases. These findings may represent chronic changes. There is no evidence for pulmonary edema. No large areas of airspace disease. Heart size is within normal limits. Stable soft tissue density in the right paratracheal region is probably vascular in etiology. No large pleural effusions. No acute bone abnormality. IMPRESSION: Few prominent lung markings at the bases could represent chronic changes. Otherwise, no acute chest findings. Electronically Signed   By: Richarda OverlieAdam  Henn M.D.   On: 05/26/2016 07:50   I have personally reviewed and evaluated these images and lab results as part of my medical decision-making.   EKG Interpretation   Date/Time:  Tuesday May 26 2016 06:55:28 EDT Ventricular Rate:  115 PR Interval:  133 QRS Duration: 77 QT Interval:  315 QTC Calculation: 436 R Axis:   33 Text Interpretation:  Sinus tachycardia LAE, consider biatrial enlargement  Baseline wander in lead(s) V2 No significant change since last tracing  Confirmed by Jawana Reagor MD, Reuel BoomANIEL [id number]) on 05/26/2016 7:00:52 AM Also  confirmed by Adela LankFLOYD MD, DANIEL [phone]), editor Whitney PostLOGAN, Cala BradfordKIMBERLY [phone])  on  05/26/2016 8:43:31 AM      MDM   Final diagnoses:  Sinus tachycardia (HCC)    60 yo F with a chief complaint of pinpoint chest pain and shortness of breath. This happened suddenly this morning and woke her from sleep. Patient is in a sinus tachycardia that rapidly increases and decreases. Patient denies any illegal drug use. Symptoms improved with IV fluids and Ativan. We'll obtain a d-dimer delta troponin. Ddimer and initial trop negative.   Patient reassessed and feeling much better. After ambulating the patient's heart rate went back into  the low 100s. Repeat EKG with a sinus arrhythmia. We'll have the patient follow-up with her family doctor. Have her return for any sudden worsening of her symptoms.  11:02 AM:  I have discussed the diagnosis/risks/treatment options with the patient and believe the pt to be eligible for discharge home to follow-up with PCP. We also discussed returning to the ED immediately if new or worsening sx occur. We discussed the sx which are most concerning (e.g., sudden worsening chest pain, sob, palpitations) that necessitate immediate return. Medications administered to the patient during their visit and any new prescriptions provided to the patient are listed below.  Medications given during this visit Medications  LORazepam (ATIVAN) injection 1 mg (1 mg Intravenous Given 05/26/16 0752)  sodium chloride 0.9 % bolus 1,000 mL (0 mLs Intravenous Stopped 05/26/16 1042)    New Prescriptions   No medications on file    The patient appears reasonably screen and/or stabilized for discharge and I doubt any other medical condition or other [facility] requiring further screening, evaluation, or treatment in the ED at this time prior to discharge.    Melene Planan Henry Utsey, DO 05/26/16 1102

## 2016-05-26 NOTE — Discharge Instructions (Signed)
Follow-up with your family doctor. Return for sudden worsening of symptoms. Nonspecific Tachycardia Tachycardia is a faster than normal heartbeat (more than 100 beats per minute). In adults, the heart normally beats between 60 and 100 times a minute. A fast heartbeat may be a normal response to exercise or stress. It does not necessarily mean that something is wrong. However, sometimes when your heart beats too fast it may not be able to pump enough blood to the rest of your body. This can result in chest pain, shortness of breath, dizziness, and even fainting. Nonspecific tachycardia means that the specific cause or pattern of your tachycardia is unknown. CAUSES  Tachycardia may be harmless or it may be due to a more serious underlying cause. Possible causes of tachycardia include:  Exercise or exertion.  Fever.  Pain or injury.  Infection.  Loss of body fluids (dehydration).  Overactive thyroid.  Lack of red blood cells (anemia).  Anxiety and stress.  Alcohol.  Caffeine.  Tobacco products.  Diet pills.  Illegal drugs.  Heart disease. SYMPTOMS  Rapid or irregular heartbeat (palpitations).  Suddenly feeling your heart beating (cardiac awareness).  Dizziness.  Tiredness (fatigue).  Shortness of breath.  Chest pain.  Nausea.  Fainting. DIAGNOSIS  Your caregiver will perform a physical exam and take your medical history. In some cases, a heart specialist (cardiologist) may be consulted. Your caregiver may also order:  Blood tests.  Electrocardiography. This test records the electrical activity of your heart.  A heart monitoring test. TREATMENT  Treatment will depend on the likely cause of your tachycardia. The goal is to treat the underlying cause of your tachycardia. Treatment methods may include:  Replacement of fluids or blood through an intravenous (IV) tube for moderate to severe dehydration or anemia.  New medicines or changes in your current  medicines.  Diet and lifestyle changes.  Treatment for certain infections.  Stress relief or relaxation methods. HOME CARE INSTRUCTIONS   Rest.  Drink enough fluids to keep your urine clear or pale yellow.  Do not smoke.  Avoid:  Caffeine.  Tobacco.  Alcohol.  Chocolate.  Stimulants such as over-the-counter diet pills or pills that help you stay awake.  Situations that cause anxiety or stress.  Illegal drugs such as marijuana, phencyclidine (PCP), and cocaine.  Only take medicine as directed by your caregiver.  Keep all follow-up appointments as directed by your caregiver. SEEK IMMEDIATE MEDICAL CARE IF:   You have pain in your chest, upper arms, jaw, or neck.  You become weak, dizzy, or feel faint.  You have palpitations that will not go away.  You vomit, have diarrhea, or pass blood in your stool.  Your skin is cool, pale, and wet.  You have a fever that will not go away with rest, fluids, and medicine. MAKE SURE YOU:   Understand these instructions.  Will watch your condition.  Will get help right away if you are not doing well or get worse.   This information is not intended to replace advice given to you by your health care provider. Make sure you discuss any questions you have with your health care provider.   Document Released: 01/21/2005 Document Revised: 03/07/2012 Document Reviewed: 06/28/2015 Elsevier Interactive Patient Education [employer]2016 [employer]

## 2016-05-26 NOTE — ED Notes (Signed)
Pt ambulated to and from restroom; upon pt's return from restroom, this tech placed the pt back on the monitor via bp, pulse oximetry, and 5 lead

## 2018-03-06 ENCOUNTER — Encounter (HOSPITAL_COMMUNITY): Payer: Self-pay | Admitting: Emergency Medicine

## 2018-03-06 ENCOUNTER — Other Ambulatory Visit: Payer: Self-pay

## 2018-03-06 ENCOUNTER — Inpatient Hospital Stay (HOSPITAL_COMMUNITY)
Admission: EM | Admit: 2018-03-06 | Discharge: 2018-03-09 | DRG: 312 | Disposition: A | Discharge status: Home / Self Care | Payer: Self-pay | Attending: Internal Medicine | Admitting: Internal Medicine

## 2018-03-06 DIAGNOSIS — G9341 Metabolic encephalopathy: Secondary | ICD-10-CM | POA: Diagnosis present

## 2018-03-06 DIAGNOSIS — R55 Syncope and collapse: Principal | ICD-10-CM | POA: Diagnosis present

## 2018-03-06 DIAGNOSIS — N390 Urinary tract infection, site not specified: Secondary | ICD-10-CM | POA: Diagnosis present

## 2018-03-06 DIAGNOSIS — Z87891 Personal history of nicotine dependence: Secondary | ICD-10-CM

## 2018-03-06 DIAGNOSIS — I159 Secondary hypertension, unspecified: Secondary | ICD-10-CM

## 2018-03-06 DIAGNOSIS — E860 Dehydration: Secondary | ICD-10-CM | POA: Diagnosis present

## 2018-03-06 DIAGNOSIS — I129 Hypertensive chronic kidney disease with stage 1 through stage 4 chronic kidney disease, or unspecified chronic kidney disease: Secondary | ICD-10-CM | POA: Diagnosis present

## 2018-03-06 DIAGNOSIS — E876 Hypokalemia: Secondary | ICD-10-CM | POA: Diagnosis present

## 2018-03-06 DIAGNOSIS — I10 Essential (primary) hypertension: Secondary | ICD-10-CM | POA: Diagnosis present

## 2018-03-06 DIAGNOSIS — Z9114 Patient's other noncompliance with medication regimen: Secondary | ICD-10-CM

## 2018-03-06 DIAGNOSIS — I160 Hypertensive urgency: Secondary | ICD-10-CM | POA: Diagnosis present

## 2018-03-06 DIAGNOSIS — R000 Tachycardia, unspecified: Secondary | ICD-10-CM | POA: Diagnosis present

## 2018-03-06 DIAGNOSIS — Z880 Allergy status to penicillin: Secondary | ICD-10-CM

## 2018-03-06 DIAGNOSIS — I951 Orthostatic hypotension: Secondary | ICD-10-CM | POA: Diagnosis present

## 2018-03-06 DIAGNOSIS — N3000 Acute cystitis without hematuria: Secondary | ICD-10-CM | POA: Insufficient documentation

## 2018-03-06 DIAGNOSIS — N182 Chronic kidney disease, stage 2 (mild): Secondary | ICD-10-CM | POA: Diagnosis present

## 2018-03-06 DIAGNOSIS — N179 Acute kidney failure, unspecified: Secondary | ICD-10-CM | POA: Diagnosis present

## 2018-03-06 HISTORY — DX: Chronic kidney disease, stage 2 (mild): N18.2

## 2018-03-06 LAB — COMPREHENSIVE METABOLIC PANEL
ALBUMIN: 4.5 g/dL (ref 3.5–5.0)
ALT: 32 U/L (ref 14–54)
AST: 52 U/L — AB (ref 15–41)
Alkaline Phosphatase: 83 U/L (ref 38–126)
Anion gap: 13 (ref 5–15)
BUN: 19 mg/dL (ref 6–20)
CHLORIDE: 101 mmol/L (ref 101–111)
CO2: 22 mmol/L (ref 22–32)
CREATININE: 2.2 mg/dL — AB (ref 0.44–1.00)
Calcium: 9.4 mg/dL (ref 8.9–10.3)
GFR calc Af Amer: 27 mL/min — ABNORMAL LOW (ref >60)
GFR calc non Af Amer: 23 mL/min — ABNORMAL LOW (ref >60)
GLUCOSE: 113 mg/dL — AB (ref 65–99)
POTASSIUM: 3.3 mmol/L — AB (ref 3.5–5.1)
Sodium: 136 mmol/L (ref 135–145)
Total Bilirubin: 1 mg/dL (ref 0.3–1.2)
Total Protein: 8.3 g/dL — ABNORMAL HIGH (ref 6.5–8.1)

## 2018-03-06 LAB — URINALYSIS, ROUTINE W REFLEX MICROSCOPIC
Bilirubin Urine: NEGATIVE
Glucose, UA: NEGATIVE mg/dL
KETONES UR: 20 mg/dL — AB
Nitrite: NEGATIVE
PROTEIN: 30 mg/dL — AB
Specific Gravity, Urine: 1.019 (ref 1.005–1.030)
pH: 5 (ref 5.0–8.0)

## 2018-03-06 LAB — CBC
HCT: 43 % (ref 36.0–46.0)
HEMOGLOBIN: 14.1 g/dL (ref 12.0–15.0)
MCH: 31 pg (ref 26.0–34.0)
MCHC: 32.8 g/dL (ref 30.0–36.0)
MCV: 94.5 fL (ref 78.0–100.0)
PLATELETS: 268 10*3/uL (ref 150–400)
RBC: 4.55 MIL/uL (ref 3.87–5.11)
RDW: 13.2 % (ref 11.5–15.5)
WBC: 10.8 10*3/uL — ABNORMAL HIGH (ref 4.0–10.5)

## 2018-03-06 LAB — I-STAT CG4 LACTIC ACID, ED: Lactic Acid, Venous: 1.81 mmol/L (ref 0.5–1.9)

## 2018-03-06 LAB — TSH: TSH: 1.326 u[IU]/mL (ref 0.350–4.500)

## 2018-03-06 MED ORDER — SODIUM CHLORIDE 0.9 % IV BOLUS (SEPSIS)
1500.0000 mL | Freq: Once | INTRAVENOUS | Status: AC
  Administered 2018-03-06: 1500 mL via INTRAVENOUS

## 2018-03-06 MED ORDER — SODIUM CHLORIDE 0.9 % IV SOLN
1.0000 g | Freq: Once | INTRAVENOUS | Status: AC
  Administered 2018-03-06: 1 g via INTRAVENOUS
  Filled 2018-03-06: qty 10

## 2018-03-06 MED ORDER — ZOLPIDEM TARTRATE 5 MG PO TABS
5.0000 mg | ORAL_TABLET | Freq: Every evening | ORAL | Status: DC | PRN
  Administered 2018-03-07: 5 mg via ORAL
  Filled 2018-03-06: qty 1

## 2018-03-06 MED ORDER — POTASSIUM CHLORIDE 20 MEQ/15ML (10%) PO SOLN
40.0000 meq | Freq: Once | ORAL | Status: AC
  Administered 2018-03-06: 40 meq via ORAL
  Filled 2018-03-06: qty 30

## 2018-03-06 MED ORDER — HYDRALAZINE HCL 20 MG/ML IJ SOLN: 10.0000 mg | Freq: Once | INTRAMUSCULAR | Status: DC

## 2018-03-06 MED ORDER — HYDRALAZINE HCL 20 MG/ML IJ SOLN: 5.0000 mg | INTRAMUSCULAR | Status: DC | PRN

## 2018-03-06 MED ORDER — SODIUM CHLORIDE 0.9 % IV BOLUS (SEPSIS)
500.0000 mL | Freq: Once | INTRAVENOUS | Status: AC
  Administered 2018-03-06: 500 mL via INTRAVENOUS

## 2018-03-06 MED ORDER — ACETAMINOPHEN 325 MG PO TABS: 650.0000 mg | ORAL_TABLET | Freq: Four times a day (QID) | ORAL | Status: DC | PRN

## 2018-03-06 MED ORDER — SODIUM CHLORIDE 0.9 % IV BOLUS (SEPSIS)
1000.0000 mL | Freq: Once | INTRAVENOUS | Status: AC
  Administered 2018-03-06: 1000 mL via INTRAVENOUS

## 2018-03-06 MED ORDER — SODIUM CHLORIDE 0.9 % IV SOLN
1.0000 g | INTRAVENOUS | Status: DC
  Administered 2018-03-07: 1 g via INTRAVENOUS
  Filled 2018-03-06 (×2): qty 10

## 2018-03-06 MED ORDER — LORAZEPAM 2 MG/ML IJ SOLN: 1.0000 mg | INTRAMUSCULAR | Status: DC | PRN

## 2018-03-06 MED ORDER — SODIUM CHLORIDE 0.9 % IV SOLN
INTRAVENOUS | Status: DC
  Administered 2018-03-06 – 2018-03-07 (×3): via INTRAVENOUS

## 2018-03-06 MED ORDER — ENOXAPARIN SODIUM 40 MG/0.4ML ~~LOC~~ SOLN
40.0000 mg | SUBCUTANEOUS | Status: DC
  Administered 2018-03-08: 40 mg via SUBCUTANEOUS
  Filled 2018-03-06: qty 0.4

## 2018-03-06 MED ORDER — HYDRALAZINE HCL 20 MG/ML IJ SOLN
5.0000 mg | INTRAMUSCULAR | Status: DC | PRN
  Administered 2018-03-07 – 2018-03-08 (×3): 5 mg via INTRAVENOUS
  Filled 2018-03-06 (×3): qty 1

## 2018-03-06 MED ORDER — HYDROXYZINE HCL 10 MG PO TABS
10.0000 mg | ORAL_TABLET | Freq: Three times a day (TID) | ORAL | Status: DC | PRN
  Filled 2018-03-06: qty 1

## 2018-03-06 MED ORDER — SODIUM CHLORIDE 0.9% FLUSH
3.0000 mL | Freq: Two times a day (BID) | INTRAVENOUS | Status: DC
  Administered 2018-03-06 – 2018-03-09 (×5): 3 mL via INTRAVENOUS

## 2018-03-06 MED ORDER — LABETALOL HCL 5 MG/ML IV SOLN
10.0000 mg | Freq: Once | INTRAVENOUS | Status: AC
  Administered 2018-03-06: 10 mg via INTRAVENOUS
  Filled 2018-03-06: qty 4

## 2018-03-06 MED ORDER — AMLODIPINE BESYLATE 5 MG PO TABS: 10.0000 mg | ORAL_TABLET | Freq: Every day | ORAL | Status: DC

## 2018-03-06 NOTE — ED Triage Notes (Signed)
Pt BIB EMS for syncopal episode, possible seizure, pt denies seizure hx. Per EMS pt was doing hair felt hot, walked over to get something to drink when she "passed out" and started to shake on the ground x 1 minute. Pt denies pain, no obvious injury noted. Pt HR 160s with EMS, HR 150 on arrival. Pt A&Ox4, resp e/u. Denies CP.

## 2018-03-06 NOTE — ED Provider Notes (Signed)
[facility] EMERGENCY DEPARTMENT Provider Note   CSN: [id number] Arrival date & time: 03/06/18  1626     History   Chief Complaint Chief Complaint  Patient presents with  . Loss of Consciousness  . Seizures    HPI Tracey Ramos is a 62 y.o. female.  HPI Patient is a 62 year old female who presents to the emergency department after a syncopal episode today.  She was in the kitchen and felt warm without preceding chest pain or palpitations.  Next thing she knew she was on the ground.  Family is concerned there could have been some shaking.  No postictal period.  No history of seizures.  She has had urinary frequency over the past 4 days without dysuria.  No history of kidney stones.  Denies chest pain shortness of breath.  Denies abdominal pain.  No back pain.  No vaginal complaints.  No history of diabetes.  She currently is without a primary care physician.  She has no significant known past medical history.  Denies headache.  Patient is not on anticoagulants.   Past Medical History:  Diagnosis Date  . Cholelithiasis   . Hernia of abdominal wall   . Hypertension     There are no active problems to display for this patient.   History reviewed. No pertinent surgical history.  OB History    No data available       Home Medications    Prior to Admission medications   Not on File    Family History No family history on file.  Social History Social History   Tobacco Use  . Smoking status: Former Smoker    Types: Cigarettes  Substance Use Topics  . Alcohol use: Yes    Comment: occasional  . Drug use: Yes    Types: Marijuana    Comment: Occasionally     Allergies   Penicillins   Review of Systems Review of Systems  All other systems reviewed and are negative.    Physical Exam Updated Vital Signs BP (!) 159/96   Pulse (!) 124   Temp 98 F (36.7 C) (Oral)   Resp (!) 25   Ht 5' 5.5" (1.664 m)   Wt 79.4 kg (175 lb)   SpO2 99%    BMI 28.68 kg/m   Physical Exam  Constitutional: She is oriented to person, place, and time. She appears well-developed and well-nourished. No distress.  HENT:  Head: Normocephalic and atraumatic.  Eyes: EOM are normal.  Neck: Normal range of motion.  C-spine nontender  Cardiovascular: Regular rhythm and normal heart sounds.  Tachycardic  Pulmonary/Chest: Effort normal and breath sounds normal.  Abdominal: Soft. She exhibits no distension. There is no tenderness.  Musculoskeletal: Normal range of motion.  Neurological: She is alert and oriented to person, place, and time.  Skin: Skin is warm and dry.  Psychiatric: She has a normal mood and affect. Judgment normal.  Nursing note and vitals reviewed.    ED Treatments / Results  Labs (all labs ordered are listed, but only abnormal results are displayed) Labs Reviewed  CBC - Abnormal; Notable for the following components:      Result Value   WBC 10.8 (*)    All other components within normal limits  URINALYSIS, ROUTINE W REFLEX MICROSCOPIC - Abnormal; Notable for the following components:   APPearance HAZY (*)    Hgb urine dipstick SMALL (*)    Ketones, ur 20 (*)    Protein, ur 30 (*)  Leukocytes, UA SMALL (*)    Bacteria, UA RARE (*)    Squamous Epithelial / LPF 0-5 (*)    All other components within normal limits  COMPREHENSIVE METABOLIC PANEL - Abnormal; Notable for the following components:   Potassium 3.3 (*)    Glucose, Bld 113 (*)    Creatinine, Ser 2.20 (*)    Total Protein 8.3 (*)    AST 52 (*)    GFR calc non Af Amer 23 (*)    GFR calc Af Amer 27 (*)    All other components within normal limits  TSH  CBG MONITORING, ED    EKG  EKG Interpretation  Date/Time:  Sunday March 06 2018 16:37:59 EDT Ventricular Rate:  154 PR Interval:    QRS Duration: 74 QT Interval:  317 QTC Calculation: 508 R Axis:   42 Text Interpretation:  Sinus tachycardia Prolonged QT interval No significant change was found other  than rate Confirmed by Azalia Bilisampos, Mitchelle Sultan ([id number]) on 03/06/2018 5:08:21 PM       Radiology No results found.  Procedures Procedures (including critical care time)  Medications Ordered in ED Medications  hydrALAZINE (APRESOLINE) injection 5 mg (not administered)  sodium chloride 0.9 % bolus 1,500 mL (0 mLs Intravenous Stopped 03/06/18 1922)  cefTRIAXone (ROCEPHIN) 1 g in sodium chloride 0.9 % 100 mL IVPB (1 g Intravenous New Bag/Given 03/06/18 2116)  sodium chloride 0.9 % bolus 1,000 mL (1,000 mLs Intravenous New Bag/Given 03/06/18 2116)     Initial Impression / Assessment and Plan / ED Course  I have reviewed the triage vital signs and the nursing notes.  Pertinent labs & imaging results that were available during my care of the patient were reviewed by me and considered in my medical decision making (see chart for details).     Persistent tachycardia.  Appears to be sinus rhythm.  With fluids decreased to 120.  When she stands it rises to 170.  Her blood pressure does not drop with standing.  Blood cultures obtained.  Lactate reassuring.  Urine culture sent.  IV Rocephin.  Rise in her creatinine from her previous.  This is concerning for acute kidney injury   Final Clinical Impressions(s) / ED Diagnoses   Final diagnoses:  Acute cystitis without hematuria  Tachycardia    ED Discharge Orders    None       Azalia Bilisampos, Camera Krienke, MD 03/06/18 2305

## 2018-03-06 NOTE — ED Notes (Signed)
ED Provider at bedside. 

## 2018-03-06 NOTE — ED Notes (Signed)
Pt HR up to 170 standing to use bedside commode, back down to 140 now that she's laying back in bed. EDP made aware.

## 2018-03-06 NOTE — H&P (Signed)
History and Physical    Tracey Ramos ZOX:[id number] DOB: 1956-04-28 DOA: 03/06/2018  Referring MD/NP/PA:   PCP: Patient, No Pcp Per   Patient coming from:  The patient is coming from home.  At baseline, pt is independent for most of ADL.  Chief Complaint: Syncope, palpitation, dysuria, increased urinary frequency  HPI: Tracey Ramos is a 62 y.o. female with medical history significant of hypertension, gallstone, CKD-2, medication noncompliance, who presents with syncope, palpitation, dysuria, increased urinary frequency.  Per patient's daughter, patient passed out for about 1 min when she was doing hair at her grand daughter's home in later afternoon. Family is concerned there could have been some shaking. No postictal period.  No history of seizures. Patient does not have unilateral weakness, numbness or tingling in extremities. No facial droop or slurred speech. Patient reports palpitation, but denies chest pain, shortness of breath. No fever or chills. Denies nausea, vomiting, diarrhea, abdominal pain. Patient states that she has been having increased urinary frequency and dysuria in the past several days. Pt is not taking blood pressure medications because of lack of PCP. Patient was found to have sinus tachycardia, with HR up to 170-->144s now.   ED Course: pt was found to have elevated blood pressure 218/116, tachycardia, tachypnea, oxygen saturation section 98% on room air, temperature normal. WBC 10.8, potassium 3.3, worsening renal function, urinalysis shows small amount of leukocyte and rare bacteria. Patient is placed on stepdown for observation.   Review of Systems:   General: no fevers, chills, no body weight gain, has fatigue HEENT: no blurry vision, hearing changes or sore throat Respiratory: no dyspnea, coughing, wheezing CV: no chest pain, has palpitations GI: no nausea, vomiting, abdominal pain, diarrhea, constipation GU: has dysuria, increased urinary frequency, No  hematuria  Ext: no leg edema Neuro: no unilateral weakness, numbness, or tingling, no vision change or hearing loss. Had syncope. Skin: no rash, no skin tear. MSK: No muscle spasm, no deformity, no limitation of range of movement in spin Heme: No easy bruising.  Travel history: No recent long distant travel.  Allergy:  Allergies  Allergen Reactions  . Penicillins Swelling    Past Medical History:  Diagnosis Date  . Cholelithiasis   . CKD (chronic kidney disease), stage II   . Hernia of abdominal wall   . Hypertension     History reviewed. No pertinent surgical history.  Social History:  reports that she has quit smoking. Her smoking use included cigarettes. She does not have any smokeless tobacco history on file. She reports that she drinks alcohol. She reports that she uses drugs. Drug: Marijuana.  Family History:  Family History  Problem Relation Age of Onset  . Depression Mother   . Hypertension Father   . Heart attack Father      Prior to Admission medications   Not on File    Physical Exam: Vitals:   03/06/18 1800 03/06/18 1900 03/06/18 2115 03/06/18 2215  BP: (!) 147/89 (!) 159/96 (!) 183/111 (!) 161/104  Pulse: (!) 114 (!) 124 (!) 128 (!) 132  Resp: (!) 24 (!) 25 (!) 23 (!) 21  Temp:      TempSrc:      SpO2: 100% 99% 98% 99%  Weight:      Height:       General: Not in acute distress HEENT:       Eyes: PERRL, EOMI, no scleral icterus.       ENT: No discharge from the ears and nose, no pharynx  injection, no tonsillar enlargement.        Neck: No JVD, no bruit, no mass felt. Heme: No neck lymph node enlargement. Cardiac: S1/S2, RRR, No murmurs, No gallops or rubs. Respiratory:  No rales, wheezing, rhonchi or rubs. GI: Soft, nondistended, nontender, no rebound pain, no organomegaly, BS present. GU: no hematuria. Ext: No pitting leg edema bilaterally. 2+DP/PT pulse bilaterally. Musculoskeletal: No joint deformities, No joint redness or warmth, no  limitation of ROM in spin. Skin: No rashes.  Neuro: Alert, oriented X3, cranial nerves II-XII grossly intact, moves all extremities normally. Muscle strength 5/5 in all extremities, sensation to light touch intact. Brachial reflex 2+ bilaterally. Negative Babinski's sign. Normal finger to nose test. Psych: Patient is not psychotic, no suicidal or hemocidal ideation.  Labs on Admission: I have personally reviewed following labs and imaging studies  CBC: Recent Labs  Lab 03/06/18 1652  WBC 10.8*  HGB 14.1  HCT 43.0  MCV 94.5  PLT 268   Basic Metabolic Panel: Recent Labs  Lab 03/06/18 1706  NA 136  K 3.3*  CL 101  CO2 22  GLUCOSE 113*  BUN 19  CREATININE 2.20*  CALCIUM 9.4   GFR: Estimated Creatinine Clearance: 28.3 mL/min (A) (by C-G formula based on SCr of 2.2 mg/dL (H)). Liver Function Tests: Recent Labs  Lab 03/06/18 1706  AST 52*  ALT 32  ALKPHOS 83  BILITOT 1.0  PROT 8.3*  ALBUMIN 4.5   No results for input(s): LIPASE, AMYLASE in the last 168 hours. No results for input(s): AMMONIA in the last 168 hours. Coagulation Profile: No results for input(s): INR, PROTIME in the last 168 hours. Cardiac Enzymes: No results for input(s): CKTOTAL, CKMB, CKMBINDEX, TROPONINI in the last 168 hours. BNP (last 3 results) No results for input(s): PROBNP in the last 8760 hours. HbA1C: No results for input(s): HGBA1C in the last 72 hours. CBG: No results for input(s): GLUCAP in the last 168 hours. Lipid Profile: No results for input(s): CHOL, HDL, LDLCALC, TRIG, CHOLHDL, LDLDIRECT in the last 72 hours. Thyroid Function Tests: Recent Labs    03/06/18 1954  TSH 1.326   Anemia Panel: No results for input(s): VITAMINB12, FOLATE, FERRITIN, TIBC, IRON, RETICCTPCT in the last 72 hours. Urine analysis:    Component Value Date/Time   COLORURINE YELLOW 03/06/2018 2004   APPEARANCEUR HAZY (A) 03/06/2018 2004   LABSPEC 1.019 03/06/2018 2004   PHURINE 5.0 03/06/2018 2004    GLUCOSEU NEGATIVE 03/06/2018 2004   HGBUR SMALL (A) 03/06/2018 2004   BILIRUBINUR NEGATIVE 03/06/2018 2004   KETONESUR 20 (A) 03/06/2018 2004   PROTEINUR 30 (A) 03/06/2018 2004   UROBILINOGEN 0.2 05/12/2014 0351   NITRITE NEGATIVE 03/06/2018 2004   LEUKOCYTESUR SMALL (A) 03/06/2018 2004   Sepsis Labs: @LABRCNTIP (procalcitonin:4,lacticidven:4) )No results found for this or any previous visit (from the past 240 hour(s)).   Radiological Exams on Admission: No results found.   EKG: Independently reviewed. QTC 508, sinus tachycardia, Q waves in lead 3/aVf.   Assessment/Plan Principal Problem:   Syncope Active Problems:   Hypertension   Acute lower UTI   Hypokalemia   Acute renal failure superimposed on stage 2 chronic kidney disease (HCC)   Hypertensive urgency   Syncope: Etiology is not clear. Possibly due to tachycardia. Her heart rate was up to 170s. No focal neurological findings on physical examination, less likely to have stroke. HR improved to 130s after given one dose of 10 mg labetalolol.  - will place on tele bed for  obs - Orthostatic vital signs  - Trending troponins x3, UDS - EEG - 2d echo - Neuro checks  - IVF: 3L NS, and then 125 cc/h  Tachycardia: Heart rate up to 170. Repeated EKG morning still shows sinus tachycardia. Etiology is not clear. -Follow-up troponin 3, TSH, 2-D echo -When necessary labetalol  Hypertensive urgency: bp is up to 218/116, which improved to 159/88 after treated with IV labetalol and hydralazine. Not taking any meds at home -start amlodipine 10 mg daily -IV hydralazine when necessary  Questionable UTI: pt has positive urinalysis with small amount of leukocyte, but rare bacteria, since patient is symptomatic with increased urinary frequency and dysuria, will treat her as UTI now. -rocephin IV -f/u Bx and Ux  Hypokalemia: K=3.3 on admission. - Repleted - Check Mg level  Acute renal failure superimposed on stage 2 chronic kidney  disease (HCC): Baseline Cre is 1.1 in 2017, pt's Cre is  2.2 and BUN 19 on admission. Likely due to prerenal secondary to dehydration. - IVF as above - Check FeNa  - Follow up renal function by BMP   DVT ppx: SQ Lovenox Code Status: Full code Family Communication:  Yes, patient's daughter at bed side Disposition Plan:  Anticipate discharge back to previous home environment Consults called:  none Admission status: Obs / tele      Date of Service 03/06/2018    Lorretta Harp [facility] Pager [phone]  If 7PM-7AM, please contact night-coverage [email or web address] Password [email or web address] 03/06/2018, 11:50 PM

## 2018-03-07 ENCOUNTER — Observation Stay (HOSPITAL_COMMUNITY): Payer: Self-pay

## 2018-03-07 DIAGNOSIS — R55 Syncope and collapse: Principal | ICD-10-CM

## 2018-03-07 DIAGNOSIS — G9341 Metabolic encephalopathy: Secondary | ICD-10-CM

## 2018-03-07 DIAGNOSIS — I160 Hypertensive urgency: Secondary | ICD-10-CM

## 2018-03-07 LAB — CBC
HCT: 38.3 % (ref 36.0–46.0)
Hemoglobin: 12.5 g/dL (ref 12.0–15.0)
MCH: 30.6 pg (ref 26.0–34.0)
MCHC: 32.6 g/dL (ref 30.0–36.0)
MCV: 93.9 fL (ref 78.0–100.0)
PLATELETS: 243 10*3/uL (ref 150–400)
RBC: 4.08 MIL/uL (ref 3.87–5.11)
RDW: 13.1 % (ref 11.5–15.5)
WBC: 13 10*3/uL — AB (ref 4.0–10.5)

## 2018-03-07 LAB — BRAIN NATRIURETIC PEPTIDE: B Natriuretic Peptide: 32.1 pg/mL (ref 0.0–100.0)

## 2018-03-07 LAB — BASIC METABOLIC PANEL
Anion gap: 11 (ref 5–15)
BUN: 12 mg/dL (ref 6–20)
CHLORIDE: 111 mmol/L (ref 101–111)
CO2: 17 mmol/L — ABNORMAL LOW (ref 22–32)
CREATININE: 1.21 mg/dL — AB (ref 0.44–1.00)
Calcium: 8.6 mg/dL — ABNORMAL LOW (ref 8.9–10.3)
GFR calc Af Amer: 55 mL/min — ABNORMAL LOW (ref >60)
GFR calc non Af Amer: 47 mL/min — ABNORMAL LOW (ref >60)
GLUCOSE: 118 mg/dL — AB (ref 65–99)
Potassium: 3.6 mmol/L (ref 3.5–5.1)
SODIUM: 139 mmol/L (ref 135–145)

## 2018-03-07 LAB — RAPID URINE DRUG SCREEN, HOSP PERFORMED
Amphetamines: NOT DETECTED
BARBITURATES: NOT DETECTED
Benzodiazepines: NOT DETECTED
Cocaine: NOT DETECTED
Opiates: NOT DETECTED
TETRAHYDROCANNABINOL: POSITIVE — AB

## 2018-03-07 LAB — URINE CULTURE: Culture: <10000 — AB

## 2018-03-07 LAB — LACTIC ACID, PLASMA: Lactic Acid, Venous: 1.8 mmol/L (ref 0.5–1.9)

## 2018-03-07 LAB — ECHOCARDIOGRAM COMPLETE
HEIGHTINCHES: 65 in
WEIGHTICAEL: 3081.6 [oz_av]

## 2018-03-07 LAB — CREATININE, URINE, RANDOM: CREATININE, URINE: 295.87 mg/dL

## 2018-03-07 LAB — TROPONIN I
Troponin I: <0.03 ng/mL (ref <0.03)
Troponin I: <0.03 ng/mL (ref <0.03)

## 2018-03-07 LAB — HIV ANTIBODY (ROUTINE TESTING W REFLEX): HIV Screen 4th Generation wRfx: NONREACTIVE

## 2018-03-07 LAB — MAGNESIUM: Magnesium: 2.2 mg/dL (ref 1.7–2.4)

## 2018-03-07 LAB — SODIUM, URINE, RANDOM: SODIUM UR: 115 mmol/L

## 2018-03-07 MED ORDER — LABETALOL HCL 5 MG/ML IV SOLN
5.0000 mg | Freq: Once | INTRAVENOUS | Status: AC
  Administered 2018-03-07: 5 mg via INTRAVENOUS
  Filled 2018-03-07: qty 4

## 2018-03-07 MED ORDER — FUROSEMIDE 10 MG/ML IJ SOLN
20.0000 mg | Freq: Once | INTRAMUSCULAR | Status: DC
  Filled 2018-03-07: qty 2

## 2018-03-07 MED ORDER — METOPROLOL TARTRATE 25 MG PO TABS
25.0000 mg | ORAL_TABLET | Freq: Two times a day (BID) | ORAL | Status: DC
  Administered 2018-03-07 – 2018-03-08 (×3): 25 mg via ORAL
  Filled 2018-03-07 (×3): qty 1

## 2018-03-07 MED ORDER — AMLODIPINE BESYLATE 10 MG PO TABS
10.0000 mg | ORAL_TABLET | Freq: Every day | ORAL | Status: DC
  Administered 2018-03-07 – 2018-03-09 (×3): 10 mg via ORAL
  Filled 2018-03-07 (×2): qty 1
  Filled 2018-03-07: qty 2

## 2018-03-07 MED ORDER — METOPROLOL TARTRATE 5 MG/5ML IV SOLN
5.0000 mg | Freq: Four times a day (QID) | INTRAVENOUS | Status: DC | PRN
  Administered 2018-03-07 – 2018-03-08 (×3): 5 mg via INTRAVENOUS
  Filled 2018-03-07 (×3): qty 5

## 2018-03-07 NOTE — ED Notes (Signed)
Patient transported to CT 

## 2018-03-07 NOTE — Care Management Note (Addendum)
Case Management Note  Patient Details  Name: Tracey Ramos MRN: [id number] Date of Birth: 09/23/1956  Subjective/Objective:  Pt presented for syncope and palpitations. PTA from home and has support of daughter. Pt is without insurance and PCP.   Action/Plan: CM did call the [facility] for hospital follow up- awaiting phone call back to schedule an appointment. Once scheduled pt will be able [facility] for medications that range in cost from $4.00-$10.00.   Expected Discharge Date:                  Expected Discharge Plan:  Home/Self Care  In-House Referral:  NA  Discharge planning Services  CM Consult, [facility], Follow-up appt scheduled, Medication Assistance  Post Acute Care Choice:  NA Choice offered to:  NA  DME Arranged:  N/A DME Agency:  NA  HH Arranged:  NA HH Agency:  NA  Status of Service:  Completed, signed off  If discussed at Long Length of Stay Meetings, dates discussed:    Additional Comments: 03-09-18 [address] Tomi BambergerBrenda Graves-Bigelow, [place] [phone] CM did speak with pt in regards to [facility]-pt and daughter declined services. Pt is aware [facility]. No further needs from CM @ this time.     03-07-18 Tomi BambergerBrenda Graves-Bigelow, RN,BSN [phone] Hospital Follow Up Scheduled for 03-25-18 @ 11:15 am with Sindy Messingoger Gomez PA. CM will make pt aware. No further needs from CM at this time.  Gala LewandowskyGraves-Bigelow, Letetia Romanello Kaye, RN 03/07/2018, 10:28 AM

## 2018-03-07 NOTE — ED Notes (Signed)
Requested sec. Page Dr. Clyde LundborgNiu concerning pt's heart rate, which has been spiking to to 150s.  Requested EKG be done

## 2018-03-07 NOTE — ED Notes (Signed)
Attempted to call report

## 2018-03-07 NOTE — Progress Notes (Signed)
PROGRESS NOTE  Tracey Ramos WJX:[id number] DOB: 17-Sep-1956 DOA: 03/06/2018 PCP: Patient, No Pcp Per  HPI/Recap of past 55 hours: 62 year old female with past history of THC use who has not been to a doctor in many years brought in after a syncopal event.  No previous history of seizures.  Family was concerned she may have had some shaking afterwards.  When she came into the emergency room, noted to be sinus tachycardic and with acute kidney injury.  Given several liters of fluid and blood pressure which was mildly elevated on admission has since become markedly elevated ranging from 170s-200s.  Heart rate continues to remain tachycardic.  CT scan of the head unremarkable.  This morning, patient's daughter feels like patient is not 100% herself.  Answers most questions but not all correctly.  Blood pressure still ranging from 160s-190s.  Heart rate ranging from 1 teens-140s.  Started on IV Lopressor and p.o. metoprolol.  Patient herself with no complaints.  Assessment/Plan: Principal Problem:   Syncope: Suspect orthostatic hypotension versus hypertensive urgency?  Echocardiogram unrevealing.  No events on telemetry, continue to monitor Active Problems: Acute encephalopathy: Patient still not back to baseline.  Oriented for some questions including who the president is, but thought that it was last year.  Patient fully alert and oriented prior to coming in.  Checking MRI could also be PRES Bacteria in urine: Unremarkable for UTI really.  Awaiting urine culture   Hypokalemia   Acute renal failure superimposed on stage 2 chronic kidney disease (HCC): With fluids, back to Baseline.   Hypertensive urgency suspect she has had markedly elevated blood pressure and likely been tachycardia for many years and has not really ever seen a doctor.  Have started metoprolol,: Also start p.o. hydralazine.  May have been worsened by aggressive fluid resuscitation.  Have stopped fluids and started Lasix.   Echocardiogram unrevealing for CHF   Code Status: Full code  Family Communication: Daughter at the bedside  Disposition Plan: Discharge home once blood pressure under control, better oriented   Consultants:  None  Procedures:  Echocardiogram notes preserved ejection fraction no evidence of diastolic dysfunction  Antimicrobials:  Rocephin  DVT prophylaxis:  Lovenox   Objective: Vitals:   03/07/18 0700 03/07/18 0730 03/07/18 0813 03/07/18 1800  BP: (!) 190/99 (!) 147/113 (!) 151/86 (!) 192/111  Pulse: (!) 140 (!) 135 (!) 138   Resp: (!) 26 (!) 22    Temp:   98.4 F (36.9 C)   TempSrc:   Oral   SpO2: 100% 99%    Weight:   87.4 kg (192 lb 9.6 oz)   Height:   5\' 5"  (1.651 m)     Intake/Output Summary (Last 24 hours) at 03/07/2018 1902 Last data filed at 03/07/2018 1811 Gross per 24 hour  Intake 4800 ml  Output -  Net 4800 ml   Filed Weights   03/06/18 1639 03/07/18 0813  Weight: 79.4 kg (175 lb) 87.4 kg (192 lb 9.6 oz)   Body mass index is 32.05 kg/m.  Exam:   General: Alert and oriented times 2, almost 3  HEENT: Normocephalic and atraumatic, mucous members are slightly dry  Neck: Supple, no JVD   Cardiovascular: Tachycardic, regular rhythm  Respiratory: Clear to auscultation   Abdomen: Soft, nontender, nondistended, positive bowel sounds  Musculoskeletal: No clubbing or cyanosis or edema  Skin: No skin breaks, tears or lesions  Psychiatric: Almost fully appropriate, no evidence of acute psychoses and  Neuro: No focal deficits  Data Reviewed: CBC: Recent Labs  Lab 03/06/18 1652 03/07/18 0413  WBC 10.8* 13.0*  HGB 14.1 12.5  HCT 43.0 38.3  MCV 94.5 93.9  PLT 268 243   Basic Metabolic Panel: Recent Labs  Lab 03/06/18 1706 03/07/18 0007 03/07/18 0413  NA 136  --  139  K 3.3*  --  3.6  CL 101  --  111  CO2 22  --  17*  GLUCOSE 113*  --  118*  BUN 19  --  12  CREATININE 2.20*  --  1.21*  CALCIUM 9.4  --  8.6*  MG  --  2.2   --    GFR: Estimated Creatinine Clearance: 53.3 mL/min (A) (by C-G formula based on SCr of 1.21 mg/dL (H)). Liver Function Tests: Recent Labs  Lab 03/06/18 1706  AST 52*  ALT 32  ALKPHOS 83  BILITOT 1.0  PROT 8.3*  ALBUMIN 4.5   No results for input(s): LIPASE, AMYLASE in the last 168 hours. No results for input(s): AMMONIA in the last 168 hours. Coagulation Profile: No results for input(s): INR, PROTIME in the last 168 hours. Cardiac Enzymes: Recent Labs  Lab 03/07/18 0007 03/07/18 0413 03/07/18 1226  TROPONINI <0.03 <0.03 <0.03   BNP (last 3 results) No results for input(s): PROBNP in the last 8760 hours. HbA1C: No results for input(s): HGBA1C in the last 72 hours. CBG: No results for input(s): GLUCAP in the last 168 hours. Lipid Profile: No results for input(s): CHOL, HDL, LDLCALC, TRIG, CHOLHDL, LDLDIRECT in the last 72 hours. Thyroid Function Tests: Recent Labs    03/06/18 1954  TSH 1.326   Anemia Panel: No results for input(s): VITAMINB12, FOLATE, FERRITIN, TIBC, IRON, RETICCTPCT in the last 72 hours. Urine analysis:    Component Value Date/Time   COLORURINE YELLOW 03/06/2018 2004   APPEARANCEUR HAZY (A) 03/06/2018 2004   LABSPEC 1.019 03/06/2018 2004   PHURINE 5.0 03/06/2018 2004   GLUCOSEU NEGATIVE 03/06/2018 2004   HGBUR SMALL (A) 03/06/2018 2004   BILIRUBINUR NEGATIVE 03/06/2018 2004   KETONESUR 20 (A) 03/06/2018 2004   PROTEINUR 30 (A) 03/06/2018 2004   UROBILINOGEN 0.2 05/12/2014 0351   NITRITE NEGATIVE 03/06/2018 2004   LEUKOCYTESUR SMALL (A) 03/06/2018 2004   Sepsis Labs: @LABRCNTIP (procalcitonin:4,lacticidven:4)  ) Recent Results (from the past 240 hour(s))  Urine culture     Status: Abnormal   Collection Time: 03/06/18  8:40 PM  Result Value Ref Range Status   Specimen Description URINE, RANDOM  Final   Special Requests NONE  Final   Culture (A)  Final    <10,000 COLONIES/mL INSIGNIFICANT GROWTH Performed at [facility], [address], [place], [place] [address]    Report Status 03/07/2018 FINAL  Final  Blood culture (routine x 2)     Status: None (Preliminary result)   Collection Time: 03/06/18  9:10 PM  Result Value Ref Range Status   Specimen Description BLOOD LEFT HAND  Final   Special Requests IN PEDIATRIC BOTTLE Blood Culture adequate volume  Final   Culture   Final    NO GROWTH < 24 HOURS Performed at [facility], [address], [place], [place] [address]    Report Status PENDING  Incomplete  Blood culture (routine x 2)     Status: None (Preliminary result)   Collection Time: 03/06/18  9:10 PM  Result Value Ref Range Status   Specimen Description BLOOD RIGHT HAND  Final   Special Requests IN PEDIATRIC BOTTLE  Blood Culture adequate volume  Final   Culture   Final    NO GROWTH < 24 HOURS Performed at [facility], [address] [place], [place] [address]    Report Status PENDING  Incomplete      Studies: Ct Head Wo Contrast  Result Date: 03/07/2018 CLINICAL DATA:  62 year old female with syncope and fainting. EXAM: CT HEAD WITHOUT CONTRAST TECHNIQUE: Contiguous axial images were obtained from the base of the skull through the vertex without intravenous contrast. COMPARISON:  None. FINDINGS: Brain: There is mild age-related atrophy and chronic microvascular ischemic changes. There is no acute intracranial hemorrhage. No mass effect or midline shift. No extra-axial fluid collection. Vascular: No hyperdense vessel or unexpected calcification. Skull: Normal. Negative for fracture or focal lesion. Sinuses/Orbits: There is mild mucoperiosteal thickening of paranasal sinuses. No air-fluid levels. The mastoid air cells are clear. Other: None IMPRESSION: 1. No acute intracranial pathology. 2. Mild age-related atrophy and chronic microvascular ischemic changes. Electronically Signed   By: Elgie CollardArash  Radparvar M.D.   On: 03/07/2018 00:36    Scheduled Meds: . amLODipine  10 mg Oral Daily  .  enoxaparin (LOVENOX) injection  40 mg Subcutaneous Q24H  . metoprolol tartrate  25 mg Oral BID  . sodium chloride flush  3 mL Intravenous Q12H    Continuous Infusions: . sodium chloride 100 mL/hr at 03/07/18 1802  . cefTRIAXone (ROCEPHIN)  IV       LOS: 0 days     Hollice EspySendil K Krishnan, MD [facility]  To reach me or the doctor on call, go to: [email or web address] Password [email or web address]  03/07/2018, 7:02 PM

## 2018-03-07 NOTE — Progress Notes (Signed)
  Echocardiogram 2D Echocardiogram has been performed.  Tracey Ramos [name] [name] 03/07/2018, 3:01 PM

## 2018-03-07 NOTE — Progress Notes (Signed)
Went to check on patient and to take vital signs. Pt was resting but upon assessment HR was between 120-130's and BPs ranging in the 190's-200's systolic and 100-110's diastolic. Pt at this time was complaining of a headache. Gave 5mg  of hydralazine. Paged Dr. Rito EhrlichKrishnan and received orders to d/c IV fluids, give 5mg  of IV lopressor early and give a 1x dose of 20mg  of lasix. Upon giving patient these meds, pt refused and started to become slightly agitated. I explained to the pt the risks of not taking the medication. At this time pt is AxO x4. Spoke with pts daughter and explained the situation.Spoke with CN and she also attempted to give the pt meds and the pt refused again. Reported this information to the oncoming night shift RN. She will attempt to give meds when daughter comes back.  Tera Helperooper, Tyra Gural E

## 2018-03-07 NOTE — Procedures (Signed)
HPI:  62 y/o with syncope  TECHNICAL SUMMARY:  A multichannel referential and bipolar montage EEG using the standard international 10-20 system was performed on the patient described as awake.  The dominant background activity consists of 9 to 10 hertz activity seen most prominantly over the posterior head region.  The backgound activity is reactive to eye opening and closing procedures.  Low voltage fast (beta) activity is distributed symmetrically and maximally over the anterior head regions.  ACTIVATION:  Stepwise photic stimulation at 4-20 flashes per second was performed and did not elicit any abnormal waveforms.  Hyperventilation was not performed.  EPILEPTIFORM ACTIVITY:  There were no spikes, sharp waves or paroxysmal activity.  SLEEP: No sleep is noted.  CARDIAC:  The EKG lead revealed a sinus tachycardia at 124 bpm.    IMPRESSION:  This is a normal EEG for the patients stated age.  There were no focal, hemispheric or lateralizing features.  No epileptiform activity was recorded.  A normal EEG does not exclude the diagnosis of a seizure disorder and if seizure remains high on the list of differential diagnosis, an ambulatory EEG may be of value.  As above, the EKG lead did reveal a tachycardic rhythm.  Clinical correlation is required.

## 2018-03-07 NOTE — Progress Notes (Signed)
EEG completed, results pending. 

## 2018-03-08 LAB — COMPREHENSIVE METABOLIC PANEL
ALT: 22 U/L (ref 14–54)
AST: 28 U/L (ref 15–41)
Albumin: 3.9 g/dL (ref 3.5–5.0)
Alkaline Phosphatase: 69 U/L (ref 38–126)
Anion gap: 11 (ref 5–15)
BUN: 7 mg/dL (ref 6–20)
CHLORIDE: 108 mmol/L (ref 101–111)
CO2: 19 mmol/L — ABNORMAL LOW (ref 22–32)
Calcium: 9.4 mg/dL (ref 8.9–10.3)
Creatinine, Ser: 1.09 mg/dL — ABNORMAL HIGH (ref 0.44–1.00)
GFR, EST NON AFRICAN AMERICAN: 54 mL/min — AB (ref >60)
Glucose, Bld: 97 mg/dL (ref 65–99)
POTASSIUM: 4.1 mmol/L (ref 3.5–5.1)
Sodium: 138 mmol/L (ref 135–145)
Total Bilirubin: 0.9 mg/dL (ref 0.3–1.2)
Total Protein: 7.6 g/dL (ref 6.5–8.1)

## 2018-03-08 LAB — GLUCOSE, CAPILLARY
Glucose-Capillary: 107 mg/dL — ABNORMAL HIGH (ref 65–99)
Glucose-Capillary: 106 mg/dL — ABNORMAL HIGH (ref 65–99)

## 2018-03-08 LAB — CBC
HEMATOCRIT: 40.6 % (ref 36.0–46.0)
Hemoglobin: 13.4 g/dL (ref 12.0–15.0)
MCH: 30.9 pg (ref 26.0–34.0)
MCHC: 33 g/dL (ref 30.0–36.0)
MCV: 93.5 fL (ref 78.0–100.0)
Platelets: 251 10*3/uL (ref 150–400)
RBC: 4.34 MIL/uL (ref 3.87–5.11)
RDW: 13.2 % (ref 11.5–15.5)
WBC: 12 10*3/uL — AB (ref 4.0–10.5)

## 2018-03-08 LAB — LACTIC ACID, PLASMA: Lactic Acid, Venous: 1 mmol/L (ref 0.5–1.9)

## 2018-03-08 MED ORDER — METOPROLOL TARTRATE 100 MG PO TABS
100.0000 mg | ORAL_TABLET | Freq: Two times a day (BID) | ORAL | Status: DC
  Administered 2018-03-08 – 2018-03-09 (×2): 100 mg via ORAL
  Filled 2018-03-08 (×2): qty 1

## 2018-03-08 MED ORDER — METOPROLOL TARTRATE 50 MG PO TABS
75.0000 mg | ORAL_TABLET | ORAL | Status: AC
  Administered 2018-03-08: 75 mg via ORAL
  Filled 2018-03-08: qty 1

## 2018-03-08 NOTE — Progress Notes (Signed)
PROGRESS NOTE  Tracey Ramos ZOX:[id number] DOB: 05/15/1956 DOA: 03/06/2018 PCP: Patient, No Pcp Per  HPI/Recap of past 20 hours: 62 year old female with past history of THC use who has not been to a doctor in many years brought in after a syncopal event.  No previous history of seizures.  Family was concerned she may have had some shaking afterwards.  When she came into the emergency room, noted to be sinus tachycardic and with acute kidney injury.  Given several liters of fluid and blood pressure which was mildly elevated on admission has since become markedly elevated ranging from 170s-200s.  Heart rate continued to remain tachycardic.  CT scan of the head unremarkable.  After discussion with patient's daughter on 3/11,, patient's daughter feels like patient is not 100% herself.  Answers most questions but not all correctly.  Blood pressure still ranging from 160s-190s.  Heart rate ranging from 110's-140's.  Started on IV Lopressor and p.o. metoprolol.    No events overnight.  Patient responded well to Lasix and diuresed well.  Echocardiogram with no comment on diastolic function.  Pressure is better although not yet well controlled.  Heart rate improving, but not yet controlled.  MRI ordered, delayed  Patient more like herself.  No complaints.  No headache or chest pain or breathing issues.  Assessment/Plan: Principal Problem:   Syncope: Suspect orthostatic hypotension versus hypertensive urgency?  Echocardiogram unrevealing.  No events on telemetry, continue to monitor Active Problems: Acute encephalopathy: Patient still not back to baseline although daughter notes improvement compared to previous day..  Oriented for some questions including who the president is, but thought that it was last year.  Seems more alert today.  Patient fully alert and oriented prior to coming in.  Checking MRI could also be PRES. other possibility is that patient uncontrolled hypertension has led to chronic  vascular ischemia of the brain which may have some underlying mild dementia which is coming out now she is in the hospital Bacteria in urine: Unremarkable for UTI.  Completed Rocephin 3/12 for 3-day course now want to do we hypokalemia   Acute renal failure superimposed on stage 2 chronic kidney disease (HCC): With fluids, back to Baseline.   Hypertensive urgency suspect she has had markedly elevated blood pressure and likely been tachycardia for many years and has not really ever seen a doctor.  Further increase metoprolol,: Also start p.o. hydralazine.  May have been worsened by aggressive fluid resuscitation.  Have stopped fluids and started Lasix.  Echocardiogram unrevealing for CHF.  Better controlled following diuresis.   Code Status: Full code  Family Communication: Daughter at the bedside  Disposition Plan: Discharge home once blood pressure under control, MRI completed.  Potentially tomorrow   Consultants:  None  Procedures:  Echocardiogram notes preserved ejection fraction no evidence of diastolic dysfunction  Antimicrobials:  Rocephin 3/10-3/12  DVT prophylaxis:  Lovenox   Objective: Vitals:   03/08/18 0848 03/08/18 0904 03/08/18 1315 03/08/18 1703  BP: (!) 148/93 (!) 142/93 127/80 (!) 143/95  Pulse:  (!) 119 (!) 108   Resp:      Temp: 98.2 F (36.8 C)  98.3 F (36.8 C) 98.1 F (36.7 C)  TempSrc: Oral  Oral Oral  SpO2: 100%  99% 100%  Weight:      Height:        Intake/Output Summary (Last 24 hours) at 03/08/2018 1742 Last data filed at 03/08/2018 0850 Gross per 24 hour  Intake 1220 ml  Output 1050 ml  Net 170 ml   Filed Weights   03/06/18 1639 03/07/18 0813 03/08/18 0610  Weight: 79.4 kg (175 lb) 87.4 kg (192 lb 9.6 oz) 86.7 kg (191 lb 2.2 oz)   Body mass index is 31.81 kg/m.  Exam:   General: Alert and oriented times 3  HEENT: Normocephalic and atraumatic, mucous members moist  Neck: Supple, no JVD   Cardiovascular: Mild tachycardia,  regular rhythm-improved  Respiratory: Clear to auscultation   Abdomen: Soft, nontender, nondistended, positive bowel sounds  Musculoskeletal: No clubbing or cyanosis or edema  Skin: No skin breaks, tears or lesions  Psychiatric: Almost fully appropriate, no evidence of acute psychoses and  Neuro: No focal deficits    Data Reviewed: CBC: Recent Labs  Lab 03/06/18 1652 03/07/18 0413 03/08/18 0547  WBC 10.8* 13.0* 12.0*  HGB 14.1 12.5 13.4  HCT 43.0 38.3 40.6  MCV 94.5 93.9 93.5  PLT 268 243 251   Basic Metabolic Panel: Recent Labs  Lab 03/06/18 1706 03/07/18 0007 03/07/18 0413 03/08/18 0547  NA 136  --  139 138  K 3.3*  --  3.6 4.1  CL 101  --  111 108  CO2 22  --  17* 19*  GLUCOSE 113*  --  118* 97  BUN 19  --  12 7  CREATININE 2.20*  --  1.21* 1.09*  CALCIUM 9.4  --  8.6* 9.4  MG  --  2.2  --   --    GFR: Estimated Creatinine Clearance: 59 mL/min (A) (by C-G formula based on SCr of 1.09 mg/dL (H)). Liver Function Tests: Recent Labs  Lab 03/06/18 1706 03/08/18 0547  AST 52* 28  ALT 32 22  ALKPHOS 83 69  BILITOT 1.0 0.9  PROT 8.3* 7.6  ALBUMIN 4.5 3.9   No results for input(s): LIPASE, AMYLASE in the last 168 hours. No results for input(s): AMMONIA in the last 168 hours. Coagulation Profile: No results for input(s): INR, PROTIME in the last 168 hours. Cardiac Enzymes: Recent Labs  Lab 03/07/18 0007 03/07/18 0413 03/07/18 1226  TROPONINI <0.03 <0.03 <0.03   BNP (last 3 results) No results for input(s): PROBNP in the last 8760 hours. HbA1C: No results for input(s): HGBA1C in the last 72 hours. CBG: Recent Labs  Lab 03/08/18 0626 03/08/18 0745  GLUCAP 107* 106*   Lipid Profile: No results for input(s): CHOL, HDL, LDLCALC, TRIG, CHOLHDL, LDLDIRECT in the last 72 hours. Thyroid Function Tests: Recent Labs    03/06/18 1954  TSH 1.326   Anemia Panel: No results for input(s): VITAMINB12, FOLATE, FERRITIN, TIBC, IRON, RETICCTPCT in the  last 72 hours. Urine analysis:    Component Value Date/Time   COLORURINE YELLOW 03/06/2018 2004   APPEARANCEUR HAZY (A) 03/06/2018 2004   LABSPEC 1.019 03/06/2018 2004   PHURINE 5.0 03/06/2018 2004   GLUCOSEU NEGATIVE 03/06/2018 2004   HGBUR SMALL (A) 03/06/2018 2004   BILIRUBINUR NEGATIVE 03/06/2018 2004   KETONESUR 20 (A) 03/06/2018 2004   PROTEINUR 30 (A) 03/06/2018 2004   UROBILINOGEN 0.2 05/12/2014 0351   NITRITE NEGATIVE 03/06/2018 2004   LEUKOCYTESUR SMALL (A) 03/06/2018 2004   Sepsis Labs: @LABRCNTIP (procalcitonin:4,lacticidven:4)  ) Recent Results (from the past 240 hour(s))  Urine culture     Status: Abnormal   Collection Time: 03/06/18  8:40 PM  Result Value Ref Range Status   Specimen Description URINE, RANDOM  Final   Special Requests NONE  Final   Culture (A)  Final    <10,000 COLONIES/mL INSIGNIFICANT  GROWTH Performed at [facility], [address] [place], [place] [address]    Report Status 03/07/2018 FINAL  Final  Blood culture (routine x 2)     Status: None (Preliminary result)   Collection Time: 03/06/18  9:10 PM  Result Value Ref Range Status   Specimen Description BLOOD LEFT HAND  Final   Special Requests IN PEDIATRIC BOTTLE Blood Culture adequate volume  Final   Culture   Final    NO GROWTH 2 DAYS Performed at [facility], [address] [place], [place] [address]    Report Status PENDING  Incomplete  Blood culture (routine x 2)     Status: None (Preliminary result)   Collection Time: 03/06/18  9:10 PM  Result Value Ref Range Status   Specimen Description BLOOD RIGHT HAND  Final   Special Requests IN PEDIATRIC BOTTLE Blood Culture adequate volume  Final   Culture   Final    NO GROWTH 2 DAYS Performed at [facility], [address] [place], [place] [address]    Report Status PENDING  Incomplete      Studies: No results found.  Scheduled Meds: . amLODipine  10 mg Oral Daily  . enoxaparin (LOVENOX) injection  40 mg  Subcutaneous Q24H  . furosemide  20 mg Intravenous Once  . metoprolol tartrate  100 mg Oral BID  . sodium chloride flush  3 mL Intravenous Q12H    Continuous Infusions:    LOS: 1 day     Hollice EspySendil K Krishnan, MD [facility]  To reach me or the doctor on call, go to: [email or web address] Password [email or web address]  03/08/2018, 5:42 PM

## 2018-03-09 ENCOUNTER — Inpatient Hospital Stay (HOSPITAL_COMMUNITY): Payer: Self-pay

## 2018-03-09 ENCOUNTER — Encounter (HOSPITAL_COMMUNITY): Payer: Self-pay

## 2018-03-09 DIAGNOSIS — N390 Urinary tract infection, site not specified: Secondary | ICD-10-CM

## 2018-03-09 LAB — MRSA CULTURE: Culture: NOT DETECTED

## 2018-03-09 LAB — GLUCOSE, CAPILLARY: Glucose-Capillary: 128 mg/dL — ABNORMAL HIGH (ref 65–99)

## 2018-03-09 MED ORDER — GADOBENATE DIMEGLUMINE 529 MG/ML IV SOLN
19.0000 mL | Freq: Once | INTRAVENOUS | Status: AC | PRN
  Administered 2018-03-09: 19 mL via INTRAVENOUS

## 2018-03-09 MED ORDER — MECLIZINE HCL 12.5 MG PO TABS: 12.5000 mg | ORAL_TABLET | Freq: Three times a day (TID) | ORAL | 0 refills | Status: DC | PRN

## 2018-03-09 MED ORDER — METOPROLOL TARTRATE 100 MG PO TABS: 100.0000 mg | ORAL_TABLET | Freq: Two times a day (BID) | ORAL | 3 refills | Status: DC

## 2018-03-09 MED ORDER — AMLODIPINE BESYLATE 10 MG PO TABS: 10.0000 mg | ORAL_TABLET | Freq: Every day | ORAL | 3 refills | Status: DC

## 2018-03-09 MED FILL — AMLODIPINE BESYLATE 10 MG T: 10 | 30 days supply | Qty: 30 | Fill #0

## 2018-03-09 MED FILL — METOPROLOL TARTRATE 100 MG: 100 | 30 days supply | Qty: 60 | Fill #0

## 2018-03-09 NOTE — Plan of Care (Signed)
  Progressing Clinical Measurements: Respiratory complications will improve 03/09/2018 0641 - Progressing by Horris Latinouvall, Abisai Coble G, RN Note No s/s of respiratory complications. Cardiovascular complication will be avoided 03/09/2018 0641 - Progressing by Horris Latinouvall, Astin Sayre G, RN Note No s/s of cardiovascular complication.

## 2018-03-09 NOTE — Discharge Summary (Signed)
Physician Discharge Summary   Patient ID: Tracey Ramos MRN: [id number] DOB/AGE: 62/05/1956 62 y.o.  Admit date: 03/06/2018 Discharge date: 03/09/2018  Primary Care Physician:  Patient, No Pcp Per   Recommendations for Outpatient Follow-up:  1. Follow up with PCP appointment scheduled 2. Please obtain BMP/CBC in one week 3. May need outpatient referral to neurology if patient has any worsening of cognitive dysfunction  Home Health: Home health PT OT, RN Equipment/Devices: None  Discharge Condition: stable  CODE STATUS: FULL Diet recommendation: Heart healthy diet   Discharge Diagnoses:   . Hypertensive urgency . Acute metabolic encephalopathy . Syncope . Acute lower UTI . Hypokalemia . Acute renal failure superimposed on stage 2 chronic kidney disease (HCC)    Consults: None    Allergies:   Allergies  Allergen Reactions  . Penicillins Swelling     DISCHARGE MEDICATIONS: Allergies as of 03/09/2018      Reactions   Penicillins Swelling      Medication List    TAKE these medications   amLODipine 10 MG tablet Commonly known as:  NORVASC Take 1 tablet (10 mg total) by mouth daily. Start taking on:  03/10/2018   meclizine 12.5 MG tablet Commonly known as:  ANTIVERT Take 1 tablet (12.5 mg total) by mouth 3 (three) times daily as needed for dizziness.   metoprolol tartrate 100 MG tablet Commonly known as:  LOPRESSOR Take 1 tablet (100 mg total) by mouth 2 (two) times daily.        Brief H and P: For complete details please refer to admission H and P, but in brief 62 year old female with past history of THC use who has not been to a doctor in many years brought in after a syncopal event.  No previous history of seizures.  Family was concerned she may have had some shaking afterwards.  When she came into the emergency room, noted to be sinus tachycardic and with acute kidney injury.  Given several liters of fluid and blood pressure which was mildly elevated  on admission has since become markedly elevated ranging from 170s-200s.  Heart rate continued to remain tachycardic.  CT scan of the head unremarkable.   Hospital Course:     Syncope -Suspect likely due to orthostatic hypotension versus hypertensive urgency, dehydration and acute renal insufficiency -2D echo showed EF of 65-70%, no valve abnormalities, no regional wall motion abnormalities, mild LVH -Troponins x3-, BNP 32.1, lactic acid normal.  Hypertensive urgency with acute metabolic encephalopathy -BP much improved, continue Norvasc, beta-blocker -Mental status is improving, patient has received 3 days of IV antibiotics MRI of the brain showed numerous white matter lesions oriented perpendicular to the long axis of lateral ventricles findings concerning for but not diagnostic of demyelinating disease, sequelae of chronic ischemic microangiopathy, mild age advanced atrophy.  No active lesions suggesting demyelination.  Discussed in detail with neurology, Dr.S Aroor, who reviewed the MRI brain, did not feel patient has any active lesions of demyelination or MS.  Possibly has PRESS from uncontrolled hypertension Discussed with daughter in detail, outpatient neurology referral if mental status not improving.  Currently much more alert and oriented.    Acute lower UTI -Urine culture showed less than 10,000 colonies, insignificant growth, patient had however received 3 days of IV Rocephin    Hypokalemia -Replaced    Acute renal failure superimposed on stage 2 chronic kidney disease (HCC) Presented with creatinine of 2.2, improved to 1.0 at the time of discharge, placed on 2 antihypertensives PT OT evaluation  recommended home health PT, case management will set up.   Day of Discharge S: Feels a lot better today, orthostatic vitals negative, wants to go home  BP 129/88   Pulse 79   Temp 98 F (36.7 C) (Oral)   Resp 17   Ht 5\' 5"  (1.651 m)   Wt 84.3 kg (185 lb 14.4 oz)   SpO2 98%    BMI 30.94 kg/m   Physical Exam: General: Alert and awake oriented x3 not in any acute distress. HEENT: anicteric sclera, pupils reactive to light and accommodation CVS: S1-S2 clear no murmur rubs or gallops Chest: clear to auscultation bilaterally, no wheezing rales or rhonchi Abdomen: soft nontender, nondistended, normal bowel sounds Extremities: no cyanosis, clubbing or edema noted bilaterally Neuro: Cranial nerves II-XII intact, no focal neurological deficits   The results of significant diagnostics from this hospitalization (including imaging, microbiology, ancillary and laboratory) are listed below for reference.      Procedures/Studies: 2D echo ------------------------------------------------------------------- Study Conclusions  - Left ventricle: The cavity size was normal. Wall thickness was   increased in a pattern of mild LVH. Systolic function was   vigorous. The estimated ejection fraction was in the range of 65%   to 70%. Wall motion was normal; there were no regional wall   motion abnormalities. The study is not technically sufficient to   allow evaluation of LV diastolic function. - Left atrium: The atrium was normal in size. - Inferior vena cava: The vessel was normal in size. The   respirophasic diameter changes were in the normal range (>= 50%),   consistent with normal central venous pressure.  Impressions:  - LVEF 65-70%, mild LVH, normal wall motion, normal LA size,   normal IVC.  Ct Head Wo Contrast  Result Date: 03/07/2018 CLINICAL DATA:  62 year old female with syncope and fainting. EXAM: CT HEAD WITHOUT CONTRAST TECHNIQUE: Contiguous axial images were obtained from the base of the skull through the vertex without intravenous contrast. COMPARISON:  None. FINDINGS: Brain: There is mild age-related atrophy and chronic microvascular ischemic changes. There is no acute intracranial hemorrhage. No mass effect or midline shift. No extra-axial fluid  collection. Vascular: No hyperdense vessel or unexpected calcification. Skull: Normal. Negative for fracture or focal lesion. Sinuses/Orbits: There is mild mucoperiosteal thickening of paranasal sinuses. No air-fluid levels. The mastoid air cells are clear. Other: None IMPRESSION: 1. No acute intracranial pathology. 2. Mild age-related atrophy and chronic microvascular ischemic changes. Electronically Signed   By: Elgie Collard M.D.   On: 03/07/2018 00:36   Mr Laqueta Jean ZO Contrast  Result Date: 03/09/2018 CLINICAL DATA:  Syncope. EXAM: MRI HEAD WITHOUT AND WITH CONTRAST TECHNIQUE: Multiplanar, multiecho pulse sequences of the brain and surrounding structures were obtained without and with intravenous contrast. CONTRAST:  19mL MULTIHANCE GADOBENATE DIMEGLUMINE 529 MG/ML IV SOLN COMPARISON:  Head CT 03/07/2018 FINDINGS: Brain: The midline structures are normal. There is no acute infarct or acute hemorrhage. No mass lesion, hydrocephalus, dural abnormality or extra-axial collection. There are numerous hyperintense T2-weighted signal lesions within the periventricular and deep white matter. Many of the lesions are oriented perpendicularly to the long axis of the lateral ventricles. Atrophy is advanced for age. No chronic microhemorrhage or superficial siderosis. Vascular: Major intracranial arterial and venous sinus flow voids are preserved. Skull and upper cervical spine: The visualized skull base, calvarium, upper cervical spine and extracranial soft tissues are normal. Sinuses/Orbits: Right maxillary sinus atelectasis.  Normal orbits. IMPRESSION: 1. Numerous white matter lesions, many of which  are oriented perpendicularly to the long axis of the lateral ventricles. The findings are concerning for, though not diagnostic of, demyelinating disease. CSF sampling should be considered. Sequelae of chronic ischemic microangiopathy is also a consideration. 2. Mildly age advanced atrophy. 3. No contrast enhancing  lesions that would indicate active demyelination. 4. No acute ischemia. Electronically Signed   By: Deatra Robinson M.D.   On: 03/09/2018 04:00       LAB RESULTS: Basic Metabolic Panel: Recent Labs  Lab 03/07/18 0007 03/07/18 0413 03/08/18 0547  NA  --  139 138  K  --  3.6 4.1  CL  --  111 108  CO2  --  17* 19*  GLUCOSE  --  118* 97  BUN  --  12 7  CREATININE  --  1.21* 1.09*  CALCIUM  --  8.6* 9.4  MG 2.2  --   --    Liver Function Tests: Recent Labs  Lab 03/06/18 1706 03/08/18 0547  AST 52* 28  ALT 32 22  ALKPHOS 83 69  BILITOT 1.0 0.9  PROT 8.3* 7.6  ALBUMIN 4.5 3.9   No results for input(s): LIPASE, AMYLASE in the last 168 hours. No results for input(s): AMMONIA in the last 168 hours. CBC: Recent Labs  Lab 03/07/18 0413 03/08/18 0547  WBC 13.0* 12.0*  HGB 12.5 13.4  HCT 38.3 40.6  MCV 93.9 93.5  PLT 243 251   Cardiac Enzymes: Recent Labs  Lab 03/07/18 0413 03/07/18 1226  TROPONINI <0.03 <0.03   BNP: Invalid input(s): POCBNP CBG: Recent Labs  Lab 03/08/18 0745 03/09/18 0748  GLUCAP 106* 128*      Disposition and Follow-up: Discharge Instructions    Diet - low sodium heart healthy   Complete by:  As directed    Discharge instructions   Complete by:  As directed    Please check your BP once or twice daily. Keep a log of your BP readings so that your PCP/doctor can adjust your medications.   Increase activity slowly   Complete by:  As directed        DISPOSITION: Home   DISCHARGE FOLLOW-UP Follow-up Information    [facility]. Go on 03/25/2018.   Why:  @ 11:15 am with Sindy Messing PA for hospital follow up and to establish Primary Care Provider.  Contact information: [place] [phone]       [facility]. Go to.   Why:  This Location for Medication Assistance- medications range from $4.00-$10.00 Contact  information: [address] [place] [address] [phone]           Time coordinating discharge:      Signed:   Thad Ranger M.D. [facility] 03/09/2018, 10:56 AM Pager: [phone]

## 2018-03-09 NOTE — Discharge Instructions (Signed)
How to Take Your Blood Pressure You can take your blood pressure at home with a machine. You may need to check your blood pressure at home:  To check if you have high blood pressure (hypertension).  To check your blood pressure over time.  To make sure your blood pressure medicine is working.  Supplies needed: You will need a blood pressure machine, or monitor. You can buy one at a drugstore or online. When choosing one:  Choose one with an arm cuff.  Choose one that wraps around your upper arm. Only one finger should fit between your arm and the cuff.  Do not choose one that measures your blood pressure from your wrist or finger.  Your doctor can suggest a monitor. How to prepare Avoid these things for 30 minutes before checking your blood pressure:  Drinking caffeine.  Drinking alcohol.  Eating.  Smoking.  Exercising.  Five minutes before checking your blood pressure:  Pee.  Sit in a dining chair. Avoid sitting in a soft couch or armchair.  Be quiet. Do not talk.  How to take your blood pressure Follow the instructions that came with your machine. If you have a digital blood pressure monitor, these may be the instructions: 1. Sit up straight. 2. Place your feet on the floor. Do not cross your ankles or legs. 3. Rest your left arm at the level of your heart. You may rest it on a table, desk, or chair. 4. Pull up your shirt sleeve. 5. Wrap the blood pressure cuff around the upper part of your left arm. The cuff should be 1 inch (2.5 cm) above your elbow. It is best to wrap the cuff around bare skin. 6. Fit the cuff snugly around your arm. You should be able to place only one finger between the cuff and your arm. 7. Put the cord inside the groove of your elbow. 8. Press the power button. 9. Sit quietly while the cuff fills with air and loses air. 10. Write down the numbers on the screen. 11. Wait 2-3 minutes and then repeat steps 1-10.  What do the numbers  mean? Two numbers make up your blood pressure. The first number is called systolic pressure. The second is called diastolic pressure. An example of a blood pressure reading is "120 over 80" (or 120/80). If you are an adult and do not have a medical condition, use this guide to find out if your blood pressure is normal: Normal  First number: below 120.  Second number: below 80. Elevated  First number: 120-129.  Second number: below 80. Hypertension stage 1  First number: 130-139.  Second number: 80-89. Hypertension stage 2  First number: 140 or above.  Second number: 90 or above. Your blood pressure is above normal even if only the top or bottom number is above normal. Follow these instructions at home:  Check your blood pressure as often as your doctor tells you to.  Take your monitor to your next doctor's appointment. Your doctor will: ? Make sure you are using it correctly. ? Make sure it is working right.  Make sure you understand what your blood pressure numbers should be.  Tell your doctor if your medicines are causing side effects. Contact a doctor if:  Your blood pressure keeps being high. Get help right away if:  Your first blood pressure number is higher than 180.  Your second blood pressure number is higher than 120. This information is not intended to replace advice given   to you by your health care provider. Make sure you discuss any questions you have with your health care provider. Document Released: 11/26/2008 Document Revised: 11/11/2016 Document Reviewed: 05/22/2016 Elsevier Interactive Patient Education  2018 Elsevier Inc.  Hypertension Hypertension is another name for high blood pressure. High blood pressure forces your heart to work harder to pump blood. This can cause problems over time. There are two numbers in a blood pressure reading. There is a top number (systolic) over a bottom number (diastolic). It is best to have a blood pressure below  120/80. Healthy choices can help lower your blood pressure. You may need medicine to help lower your blood pressure if:  Your blood pressure cannot be lowered with healthy choices.  Your blood pressure is higher than 130/80.  Follow these instructions at home: Eating and drinking  If directed, follow the DASH eating plan. This diet includes: ? Filling half of your plate at each meal with fruits and vegetables. ? Filling one quarter of your plate at each meal with whole grains. Whole grains include whole wheat pasta, brown rice, and whole grain bread. ? Eating or drinking low-fat dairy products, such as skim milk or low-fat yogurt. ? Filling one quarter of your plate at each meal with low-fat (lean) proteins. Low-fat proteins include fish, skinless chicken, eggs, beans, and tofu. ? Avoiding fatty meat, cured and processed meat, or chicken with skin. ? Avoiding premade or processed food.  Eat less than 1,500 mg of salt (sodium) a day.  Limit alcohol use to no more than 1 drink a day for nonpregnant women and 2 drinks a day for men. One drink equals 12 oz of beer, 5 oz of wine, or 1 oz of hard liquor. Lifestyle  Work with your doctor to stay at a healthy weight or to lose weight. Ask your doctor what the best weight is for you.  Get at least 30 minutes of exercise that causes your heart to beat faster (aerobic exercise) most days of the week. This may include walking, swimming, or biking.  Get at least 30 minutes of exercise that strengthens your muscles (resistance exercise) at least 3 days a week. This may include lifting weights or pilates.  Do not use any products that contain nicotine or tobacco. This includes cigarettes and e-cigarettes. If you need help quitting, ask your doctor.  Check your blood pressure at home as told by your doctor.  Keep all follow-up visits as told by your doctor. This is important. Medicines  Take over-the-counter and prescription medicines only as  told by your doctor. Follow directions carefully.  Do not skip doses of blood pressure medicine. The medicine does not work as well if you skip doses. Skipping doses also puts you at risk for problems.  Ask your doctor about side effects or reactions to medicines that you should watch for. Contact a doctor if:  You think you are having a reaction to the medicine you are taking.  You have headaches that keep coming back (recurring).  You feel dizzy.  You have swelling in your ankles.  You have trouble with your vision. Get help right away if:  You get a very bad headache.  You start to feel confused.  You feel weak or numb.  You feel faint.  You get very bad pain in your: ? Chest. ? Belly (abdomen).  You throw up (vomit) more than once.  You have trouble breathing. Summary  Hypertension is another name for high blood pressure.    Making healthy choices can help lower blood pressure. If your blood pressure cannot be controlled with healthy choices, you may need to take medicine. This information is not intended to replace advice given to you by your health care provider. Make sure you discuss any questions you have with your health care provider. Document Released: 06/01/2008 Document Revised: 11/11/2016 Document Reviewed: 11/11/2016 Elsevier Interactive Patient Education  2018 Elsevier Inc.  

## 2018-03-09 NOTE — Evaluation (Signed)
Physical Therapy Evaluation Patient Details Name: Tracey Ramos MRN: [id number] DOB: 31-Aug-1956 Today's Date: 03/09/2018   History of Present Illness  Pt is a 62 y/o female admitted secondary to syncopal episode and possible seizure. 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 EEG was normal. PMH includes HTN and CKD II.   Clinical Impression  Pt is admitted secondary to problem above with deficits below. Pt presenting with weakness, decreased balance, and slow cognitive processing upon eval. REquired min to min guard A without AD for gait and stair training. Educated about safety with mobility at home, assist level, and DME use at home. Feel pt would benefit from follow up HHPT, however, unsure if pt able to receive. Will continue to follow acutely to maximize functional mobility independence and safety.     Follow Up Recommendations Home health PT;Supervision for mobility/OOB(unsure if pt eligible )    Equipment Recommendations  None recommended by PT    Recommendations for Other Services       Precautions / Restrictions Precautions Precautions: Fall Restrictions Weight Bearing Restrictions: No      Mobility  Bed Mobility Overal bed mobility: Modified Independent                Transfers Overall transfer level: Needs assistance Equipment used: None Transfers: Sit to/from Stand Sit to Stand: Min guard         General transfer comment: Min guard for safety.   Ambulation/Gait Ambulation/Gait assistance: Min guard Ambulation Distance (Feet): 200 Feet Assistive device: None Gait Pattern/deviations: Step-through pattern;Decreased stride length Gait velocity: Decreased  Gait velocity interpretation: Below normal speed for age/gender General Gait Details: Slow, slightly unsteady gait, however, no overt LOB noted. Min guard for steadying assist throughout. Educated about use of cane vs. RW  at home to increase stability.   Stairs Stairs: Yes Stairs assistance: Min assist Stair Management: One rail Right;Alternating pattern;Step to pattern;Forwards Number of Stairs: 3 General stair comments: Slow unsteady stair navigation. Min A for steadying. Pt relying on rail for steadying. Educated about use of step to pattern to increase stability. Educated pt and pt's daughter about assist to be provided with stair navigation using HHA. Also educated about use of cane with stair navigation.   Wheelchair Mobility    Modified Rankin (Stroke Patients Only)       Balance Overall balance assessment: Needs assistance Sitting-balance support: No upper extremity supported;Feet supported Sitting balance-Leahy Scale: Good     Standing balance support: No upper extremity supported;During functional activity Standing balance-Leahy Scale: Fair                               Pertinent Vitals/Pain Pain Assessment: No/denies pain    Home Living Family/patient expects to be discharged to:: Private residence Living Arrangements: Children Available Help at Discharge: Family;Available PRN/intermittently Type of Home: House Home Access: Stairs to enter Entrance Stairs-Rails: None Entrance Stairs-Number of Steps: 3 Home Layout: One level Home Equipment: Walker - 2 wheels;Cane - single point;Bedside commode      Prior Function Level of Independence: Independent               Hand Dominance   Dominant Hand: Right    Extremity/Trunk Assessment   Upper Extremity Assessment Upper Extremity Assessment: Overall WFL for tasks assessed    Lower Extremity Assessment Lower Extremity Assessment: Generalized weakness    Cervical / Trunk Assessment  Cervical / Trunk Assessment: Normal  Communication   Communication: No difficulties  Cognition Arousal/Alertness: Awake/alert Behavior During Therapy: WFL for tasks assessed/performed Overall Cognitive Status:  Impaired/Different from baseline Area of Impairment: Problem solving                             Problem Solving: Slow processing General Comments: Pt demonstrating slow motor processing. Required increased time to respond to questions when asking about home and PLOF.       General Comments General comments (skin integrity, edema, etc.): Pt's daughter present throughout session.     Exercises     Assessment/Plan    PT Assessment Patient needs continued PT services  PT Problem List Decreased strength;Decreased balance;Decreased mobility;Decreased knowledge of use of DME;Decreased knowledge of precautions       PT Treatment Interventions DME instruction;Gait training;Therapeutic activities;Functional mobility training;Stair training;Therapeutic exercise;Balance training;Neuromuscular re-education;Patient/family education    PT Goals (Current goals can be found in the Care Plan section)  Acute Rehab PT Goals Patient Stated Goal: to go home  PT Goal Formulation: With patient Time For Goal Achievement: 03/23/18 Potential to Achieve Goals: Good    Frequency Min 3X/week   Barriers to discharge        Co-evaluation               AM-PAC PT "6 Clicks" Daily Activity  Outcome Measure Difficulty turning over in bed (including adjusting bedclothes, sheets and blankets)?: None Difficulty moving from lying on back to sitting on the side of the bed? : A Little Difficulty sitting down on and standing up from a chair with arms (e.g., wheelchair, bedside commode, etc,.)?: Unable Help needed moving to and from a bed to chair (including a wheelchair)?: A Little Help needed walking in hospital room?: A Little Help needed climbing 3-5 steps with a railing? : A Little 6 Click Score: 17    End of Session Equipment Utilized During Treatment: Gait belt Activity Tolerance: Patient tolerated treatment well Patient left: in bed;with call bell/phone within reach;with  family/visitor present Nurse Communication: Mobility status PT Visit Diagnosis: Unsteadiness on feet (R26.81);Muscle weakness (generalized) (M62.81)    Time: [id number] PT Time Calculation (min) (ACUTE ONLY): 20 min   Charges:   PT Evaluation $PT Eval Low Complexity: 1 Low     PT G Codes:        Gladys DammeBrittany Franciszek Platten, PT, DPT  Acute Rehabilitation Services  Pager: [phone]   Lehman PromBrittany S Adelaide Pfefferkorn 03/09/2018, 10:52 AM

## 2018-03-09 NOTE — Progress Notes (Signed)
   03/09/18 1400  Clinical Encounter Type  Visited With Patient and family together  Visit Type Initial  Referral From Nurse  Consult/Referral To Chaplain  Spiritual Encounters  Spiritual Needs Prayer;Emotional  Stress Factors  Patient Stress Factors Exhausted  Family Stress Factors Exhausted    Pt was sitting on the bed's edge when I visited this afternoon. Staff from nutrition was on-site talking to her. Family members also on-site Chaplain to revisit Pt at a later time after she finishes with nutrition staff.   Christipher Rieger a Water quality scientistMusiko-Holley, E. I. du PontChaplain

## 2018-03-09 NOTE — Evaluation (Signed)
Speech Language Pathology Evaluation Patient Details Name: Tracey ShiversRaymia Ramos MRN: [id number] DOB: 06/04/1956 Today's Date: 03/09/2018 Time: [id number] SLP Time Calculation (min) (ACUTE ONLY): 22 min  Problem List:  Patient Active Problem List   Diagnosis Date Noted  . Syncope 03/06/2018  . Acute lower UTI 03/06/2018  . Hypokalemia 03/06/2018  . Acute renal failure superimposed on stage 2 chronic kidney disease (HCC) 03/06/2018  . Hypertensive urgency 03/06/2018  . Hypertension   . Acute cystitis without hematuria   . Tachycardia    Past Medical History:  Past Medical History:  Diagnosis Date  . Cholelithiasis   . CKD (chronic kidney disease), stage II   . Hernia of abdominal wall   . Hypertension    Past Surgical History:  Past Surgical History:  Procedure Laterality Date  . APPENDECTOMY     HPI:  Pt is a 62 y/o female admitted secondary to syncopal episode and possible seizure. 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 EEG was normal. PMH includes HTN and CKD II   Assessment / Plan / Recommendation Clinical Impression  Pt exhibits cognitive changes from baseline in the areas of working memory, particularly with storage information as she was unable to identify correct word from choice of 3 in 75% of trials. Question visual disturbance as she appeared to have difficulty seeing when drawing a clock. Unable to accurately complete the attention subutest. SLP recommends further evaluation and treatment of cognitive impairments. Daughter is supportive however pt does not have insurance and daughter unable to assist financially. Daughter stated she is going to apply for Medicaid and if pt continues to have difficulty she will request referral from PCP. SLP educated/discussed ways to facilitate memory/activities and strategies for cognitive enhancement.  Pt being discharged today.     SLP Assessment  SLP  Recommendation/Assessment: (see impression statement) SLP Visit Diagnosis: Cognitive communication deficit (R41.841)    Follow Up Recommendations  (see impression statement)    Frequency and Duration           SLP Evaluation Cognition  Overall Cognitive Status: Impaired/Different from baseline       Comprehension  Auditory Comprehension Overall Auditory Comprehension: Appears within functional limits for tasks assessed Visual Recognition/Discrimination Discrimination: Not tested Reading Comprehension Reading Status: Not tested    Expression Expression Primary Mode of Expression: Verbal Verbal Expression Overall Verbal Expression: Appears within functional limits for tasks assessed Written Expression Written Expression: (wrote with left hand )   Oral / Motor  Oral Motor/Sensory Function Overall Oral Motor/Sensory Function: Within functional limits Motor Speech Overall Motor Speech: Appears within functional limits for tasks assessed   GO                    Royce MacadamiaLitaker, Coolidge Gossard Willis 03/09/2018, 2:59 PM   Breck CoonsLisa Willis Greig Altergott M.Ed [employer]CCC-SLP Pager [phone]

## 2018-03-11 LAB — CULTURE, BLOOD (ROUTINE X 2)
CULTURE: NO GROWTH
CULTURE: NO GROWTH
SPECIAL REQUESTS: ADEQUATE
Special Requests: ADEQUATE

## 2018-03-25 ENCOUNTER — Ambulatory Visit (INDEPENDENT_AMBULATORY_CARE_PROVIDER_SITE_OTHER): Payer: Self-pay | Admitting: Physician Assistant

## 2018-03-25 ENCOUNTER — Encounter (INDEPENDENT_AMBULATORY_CARE_PROVIDER_SITE_OTHER): Payer: Self-pay | Admitting: Physician Assistant

## 2018-03-25 ENCOUNTER — Other Ambulatory Visit: Payer: Self-pay

## 2018-03-25 VITALS — BP 130/82 | HR 64 | Temp 97.6°F | Ht 66.5 in | Wt 186.2 lb

## 2018-03-25 DIAGNOSIS — Z09 Encounter for follow-up examination after completed treatment for conditions other than malignant neoplasm: Secondary | ICD-10-CM

## 2018-03-25 DIAGNOSIS — Z131 Encounter for screening for diabetes mellitus: Secondary | ICD-10-CM

## 2018-03-25 DIAGNOSIS — G9341 Metabolic encephalopathy: Secondary | ICD-10-CM

## 2018-03-25 LAB — POCT URINALYSIS DIPSTICK
Glucose, UA: NEGATIVE
LEUKOCYTES UA: NEGATIVE
Nitrite, UA: NEGATIVE
PH UA: 5.5 (ref 5.0–8.0)
PROTEIN UA: 30
RBC UA: NEGATIVE
Spec Grav, UA: >=1.03 — AB (ref 1.010–1.025)
UROBILINOGEN UA: 1 U/dL

## 2018-03-25 LAB — POCT GLYCOSYLATED HEMOGLOBIN (HGB A1C): Hemoglobin A1C: 4.8

## 2018-03-25 MED ORDER — CYANOCOBALAMIN 250 MCG PO TABS: 250.0000 ug | ORAL_TABLET | Freq: Every day | ORAL | 2 refills | Status: DC

## 2018-03-25 NOTE — Progress Notes (Signed)
Subjective:  Patient ID: Tracey Ramos, female    DOB: [birth year]  Age: 62 y.o. MRN: [id number]  CC: hospital f/u  HPI Tracey Ramos is a 62 y.o. female with a medical history of CKD2, HTN, hernia, and cholelithiasis presents as a new patient on hospital f/u. Admitted 03/06/18 and discharged 03/09/18.  Diagnosed with hypertensive urgency, acute metabolic encephalopathy, syncope, hypokalemia, AKI, and acute lower UTI.  Pt feels much better and has not had a seizure since. Reports no other seizures in her whole life. MRI reported white matter changes possibly attributed to demyelinating disease or microvascular insult. Prescribed reading glasses but otherwise does not endorse visual disturbances. No weakness or other focal neurological deficits. Does not endorse any symptoms or complaints.        Outpatient Medications Prior to Visit  Medication Sig Dispense Refill  . amLODipine (NORVASC) 10 MG tablet Take 1 tablet (10 mg total) by mouth daily. 30 tablet 3  . metoprolol tartrate (LOPRESSOR) 100 MG tablet Take 1 tablet (100 mg total) by mouth 2 (two) times daily. 60 tablet 3  . meclizine (ANTIVERT) 12.5 MG tablet Take 1 tablet (12.5 mg total) by mouth 3 (three) times daily as needed for dizziness. (Patient not taking: Reported on 03/25/2018) 30 tablet 0   No facility-administered medications prior to visit.      ROS Review of Systems  Constitutional: Negative for chills, fever and malaise/fatigue.  Eyes: Negative for blurred vision.  Respiratory: Negative for shortness of breath.   Cardiovascular: Negative for chest pain and palpitations.  Gastrointestinal: Negative for abdominal pain and nausea.  Genitourinary: Negative for dysuria and hematuria.  Musculoskeletal: Negative for joint pain and myalgias.  Skin: Negative for rash.  Neurological: Negative for tingling and headaches.  Psychiatric/Behavioral: Negative for depression. The patient is not nervous/anxious.     Objective:   Ht 5' 6.5" (1.689 m)   Wt 186 lb 3.2 oz (84.5 kg)   BMI 29.60 kg/m   Vitals:   03/25/18 1124  BP: 130/82  Pulse: 64  Temp: 97.6 F (36.4 C)  SpO2: 100%      Physical Exam  Constitutional: She is oriented to person, place, and time.  Well developed, well nourished, NAD, polite  HENT:  Head: Normocephalic and atraumatic.  Poor dentition   Eyes: Conjunctivae are normal. No scleral icterus.  Neck: Normal range of motion. Neck supple. No thyromegaly present.  Cardiovascular: Normal rate, regular rhythm and normal heart sounds.  No carotid bruit bilaterally. No LE edema bilaterally.  Pulmonary/Chest: Effort normal and breath sounds normal.  Musculoskeletal: She exhibits no edema or deformity.  Neurological: She is alert and oriented to person, place, and time. She has normal reflexes. No cranial nerve deficit. Coordination normal.  Strength 5/5 throughout. Light touch sensation intact throughout. Romberg negative. Stereognosis intact. Finger to nose testing intact bilaterally.   Skin: Skin is warm and dry. No rash noted. No erythema. No pallor.  Psychiatric: She has a normal mood and affect. Her behavior is normal. Thought content normal.  Vitals reviewed.    Assessment & Plan:   1. Screening for diabetes mellitus - HgB A1c 4.8% in clinic today.  2. Hospital discharge follow-up - Basic Metabolic Panel - CBC with Differential - Urinalysis Dipstick - Vitamin B12 - vitamin B-12 (CYANOCOBALAMIN) 250 MCG tablet; Take 1 tablet (250 mcg total) by mouth daily.  Dispense: 30 tablet; Refill: 2  3. Metabolic encephalopathy - Hepatitis panel, acute - ANA w/Reflex - Vitamin B12 - vitamin B-12 (  CYANOCOBALAMIN) 250 MCG tablet; Take 1 tablet (250 mcg total) by mouth daily.  Dispense: 30 tablet; Refill: 2  *greater than 45 minutes spent with patient and daughter. Daughter wanted an explanation of MRI findings and had many questions regarding what caused patient's seizure. Daughter  also had many questions as to the hospital findings and diagnoses.      Meds ordered this encounter  Medications  . vitamin B-12 (CYANOCOBALAMIN) 250 MCG tablet    Sig: Take 1 tablet (250 mcg total) by mouth daily.    Dispense:  30 tablet    Refill:  2    Order Specific Question:   Supervising Provider    Answer:   Quentin AngstJEGEDE, OLUGBEMIGA E [id number]]    Follow-up: Return in about 1 month (around 04/22/2018) for HTN.   Loletta Specteroger David Kaylin Marcon PA

## 2018-03-25 NOTE — Patient Instructions (Signed)
Toxic Metabolic Encephalopathy Toxic metabolic encephalopathy (TME) is a type of brain disorder caused by a change in brain chemistry. This condition may result from illnesses or conditions that cause an imbalance of fluid, minerals (electrolytes), and other substances in the body that affect the way the brain functions. It is not caused by brain damage or brain disease. TME can cause confusion and other mental disturbances, which are generally referred to as delirium. Untreated delirium may lead to permanent mental changes or worsening medical conditions. Untreated delirium is a life-threatening condition that may need to be treated in the hospital. What are the causes? Possible causes of TME that can lead to deliriuminclude:  Short-term (acute) or long-term (chronic) disease of the kidney or liver.  Not having enough fluid in the body (dehydration).  Changes in the acid level (pH) of the blood.  High or low levels of any of the following substances in the blood: ? Calcium. ? Salt (sodium). ? Sugar (glucose). ? Magnesium. ? Phosphate.  High body temperature.  Not having enough oxygen in the blood.  Low levels of B vitamins. This can result from alcohol abuse.  Certain medicines, such as steroids and medicines that reduce the activity of the immune system (immunosuppressants).  Certain infections.  What increases the risk? You may have a higher risk for TME if you:  Are elderly.  Have dementia.  Are in the hospital, especially in intensive care.  Live in a nursing home.  Had recent surgery.  Have liver or kidney disease.  Have poorly controlled diabetes.  Have chronic medical problems, especially heart or lung disease.  Are not getting enough fluids.  Have poor nutrition.  Abuse alcohol.  What are the signs or symptoms? Symptoms of TME may include:  Muscle stiffness or jerking (spasticity).  Shaking (tremors).  Flapping of the  hands.  Weakness.  Clumsiness.  Slowed breathing.  Jerky movements that you cannot control (seizures).  Not being able to stay awake (drowsiness).  Not being able to pay attention.  Loss of consciousness (coma).  Symptoms of delirium caused by TME include:  Confusion.  Difficulty focusing or concentrating, or inability to focus or concentrate.  Not knowing where you are (disorientation).  Seeing or hearing things that are not real (hallucinations).  Fearfulness.  False beliefs (delusions).  Changes in mood or personality.  Changes in speech, such as saying things that do not make sense.  Memory loss.  Irritability.  Avoiding other people (withdrawal).  Depression.  Poor judgment.  Changes in eating and sleeping patterns.  Hyperactivity.  Decreased alertness.  General mistrust of others (paranoia).  Delirium may come and go. Symptoms of delirium may start suddenly or gradually, and they often get worse at night. How is this diagnosed? This condition is diagnosed based on:  Your symptoms and behavior.  An exam to check how you are thinking, feeling, and behaving (mental status exam). To diagnose delirium, the mental status exam must rule out other possible causes of TME, and must show: ? Changes in attention and awareness. ? Changes that develop over a short period of time and tend to come and go (fluctuate). ? Changes in memory, language, and thinking that were not present before.  A physical exam.  Imaging tests, such as: ? MRI. ? CT scan.  Blood tests to: ? Measure liver and kidney function. ? Check for a lack (deficiency) of vitamin B. ? Check for changes in acid levels (pH) and changes in calcium, sodium, or magnesium levels  in the blood. ? Measure your blood sugar (glucose). ? Measure your blood oxygen level.  How is this treated? Treatment for TME depends on the cause, and it may include.  Getting fluids through an IV  tube.  Regulating calcium, sodium, glucose, or magnesium levels in the body.  Getting oxygen.  Improving nutrition.  Treating liver or kidney disease.  Adjusting certain medicines.  Treating infections.  If the cause is found and treated, delirium usually improves. Managing delirium may include:  Keeping the room well-lit and quiet.  Using calendars, pictures, and clocks to prevent disorientation.  Having frequent checks from nursing staff and visits from caregivers.  Wearing eyeglasses or a hearing aid, if needed.  Physical therapy.  Medicine to treat agitation, anxiety, hallucinations, or delusions.  Follow these instructions at home:  Drink enough fluid to keep your urine clear or pale yellow.  Take over-the-counter and prescription medicines only as told by your health care provider.  Return to your normal activities as told by your health care provider. Ask your health care provider what activities are safe for you.  Follow a healthy diet. Do not skip meals.  Do not drink alcohol.  Go to bed at the same time every night.  Keep all follow-up visits as told by your health care provider. This is important. Contact a health care provider if:  You are unable to feed yourself or hydrate yourself.  You need help at home.  You start to feel clumsy.  You start to have tremors or weakness. Get help right away if:  You have a seizure.  You lose consciousness.  You have trouble breathing.  You do not feel able to care for yourself at home.  You have a fever.  You become disoriented at home. This information is not intended to replace advice given to you by your health care provider. Make sure you discuss any questions you have with your health care provider. Document Released: 05/22/2016 Document Revised: 08/17/2016 Document Reviewed: 05/22/2016 Elsevier Interactive Patient Education  2018 [employer]. Multiple Sclerosis Multiple sclerosis (MS) is a  disease of the central nervous system. It leads to the loss of the insulating covering of the nerves (myelin sheath) of your brain. When this happens, brain signals do not get sent properly or may not get sent at all. The age of onset of MS varies. What are the causes? The cause of MS is unknown. However, it is more common in the [place] than in the [place]. What increases the risk? There is a higher number of women with MS than men. MS is not an illness that is passed down to you from your family members (inherited). However, your risk of MS is higher if you have a relative with MS. What are the signs or symptoms? The symptoms of MS occur in episodes or attacks. These attacks may last weeks to months. There may be long periods of almost no symptoms between attacks. The symptoms of MS vary. This is because of the many different ways it affects the central nervous system. The main symptoms of MS include:  Vision problems and eye pain.  Numbness.  Weakness.  Inability to move your arms, hands, feet, or legs (paralysis).  Balance problems.  Tremors.  How is this diagnosed? Your health care provider can diagnose MS with the help of imaging exams and lab tests. These may include specialized X-ray exams and spinal fluid tests. The best imaging exam to confirm a diagnosis  of MS is an MRI. How is this treated? There is no known cure for MS, but there are medicines that can decrease the number and frequency of attacks. Steroids are often used for short-term relief. Physical and occupational therapy may also help. There are also many new alternative or complementary treatments available to help control the symptoms of MS. Ask your health care provider if any of these other options are right for you. Follow these instructions at home:  Take medicines as directed by your health care provider.  Exercise as directed by your health care provider. Contact a health care  provider if: You begin to feel depressed. Get help right away if:  You develop paralysis.  You have problems with bladder, bowel, or sexual function.  You develop mental changes, such as forgetfulness or mood swings.  You have a period of uncontrolled movements (seizure). This information is not intended to replace advice given to you by your health care provider. Make sure you discuss any questions you have with your health care provider. Document Released: 12/11/2000 Document Revised: 05/21/2016 Document Reviewed: 08/21/2013 Elsevier Interactive Patient Education  2017 [employer].

## 2018-03-26 LAB — CBC WITH DIFFERENTIAL/PLATELET
BASOS ABS: 0 10*3/uL (ref 0.0–0.2)
Basos: 0 %
EOS (ABSOLUTE): 0.1 10*3/uL (ref 0.0–0.4)
Eos: 1 %
Hematocrit: 40.8 % (ref 34.0–46.6)
Hemoglobin: 13.1 g/dL (ref 11.1–15.9)
Immature Grans (Abs): 0 10*3/uL (ref 0.0–0.1)
Immature Granulocytes: 0 %
LYMPHS ABS: 2.1 10*3/uL (ref 0.7–3.1)
Lymphs: 36 %
MCH: 30.6 pg (ref 26.6–33.0)
MCHC: 32.1 g/dL (ref 31.5–35.7)
MCV: 95 fL (ref 79–97)
MONOS ABS: 0.4 10*3/uL (ref 0.1–0.9)
Monocytes: 6 %
Neutrophils Absolute: 3.3 10*3/uL (ref 1.4–7.0)
Neutrophils: 57 %
Platelets: 270 10*3/uL (ref 150–379)
RBC: 4.28 x10E6/uL (ref 3.77–5.28)
RDW: 12.9 % (ref 12.3–15.4)
WBC: 5.9 10*3/uL (ref 3.4–10.8)

## 2018-03-26 LAB — BASIC METABOLIC PANEL
BUN/Creatinine Ratio: 9 — ABNORMAL LOW (ref 12–28)
BUN: 10 mg/dL (ref 8–27)
CHLORIDE: 103 mmol/L (ref 96–106)
CO2: 25 mmol/L (ref 20–29)
Calcium: 9.9 mg/dL (ref 8.7–10.3)
Creatinine, Ser: 1.12 mg/dL — ABNORMAL HIGH (ref 0.57–1.00)
GFR calc Af Amer: 61 mL/min/{1.73_m2} (ref >59)
GFR calc non Af Amer: 53 mL/min/{1.73_m2} — ABNORMAL LOW (ref >59)
GLUCOSE: 87 mg/dL (ref 65–99)
POTASSIUM: 3.9 mmol/L (ref 3.5–5.2)
SODIUM: 141 mmol/L (ref 134–144)

## 2018-03-26 LAB — HEPATITIS PANEL, ACUTE
HEP A IGM: NEGATIVE
HEP B S AG: NEGATIVE
Hep B C IgM: NEGATIVE
Hep C Virus Ab: <0.1 s/co ratio (ref 0.0–0.9)

## 2018-03-26 LAB — ANA W/REFLEX: Anti Nuclear Antibody(ANA): NEGATIVE

## 2018-03-26 LAB — VITAMIN B12: Vitamin B-12: 486 pg/mL (ref 232–1245)

## 2018-03-30 ENCOUNTER — Telehealth (INDEPENDENT_AMBULATORY_CARE_PROVIDER_SITE_OTHER): Payer: Self-pay

## 2018-03-30 NOTE — Telephone Encounter (Signed)
-----   Message from Loletta Specteroger David Gomez, PA-C sent at 03/28/2018  5:27 PM EDT ----- Results are normal.  Serum creatine mildly elevated but is at her baseline.

## 2018-03-30 NOTE — Telephone Encounter (Signed)
Patients daughter aware of normal labs, but mildly elevated serum creatinine but still at baseline level. She will inform patient. Maryjean Mornempestt S Roberts, CMA

## 2018-04-04 MED FILL — METOPROLOL TARTRATE 100 MG: 100 | 30 days supply | Qty: 60 | Fill #1

## 2018-04-04 MED FILL — AMLODIPINE BESYLATE 10 MG T: 10 | 30 days supply | Qty: 30 | Fill #1

## 2018-04-19 ENCOUNTER — Ambulatory Visit: Payer: Self-pay

## 2018-04-22 ENCOUNTER — Ambulatory Visit: Payer: Self-pay | Attending: Physician Assistant

## 2018-04-26 ENCOUNTER — Other Ambulatory Visit: Payer: Self-pay

## 2018-04-26 ENCOUNTER — Encounter (INDEPENDENT_AMBULATORY_CARE_PROVIDER_SITE_OTHER): Payer: Self-pay | Admitting: Physician Assistant

## 2018-04-26 ENCOUNTER — Ambulatory Visit (INDEPENDENT_AMBULATORY_CARE_PROVIDER_SITE_OTHER): Payer: Self-pay | Admitting: Physician Assistant

## 2018-04-26 VITALS — BP 118/77 | HR 83 | Temp 97.4°F | Ht 66.5 in | Wt 184.0 lb

## 2018-04-26 DIAGNOSIS — Z1231 Encounter for screening mammogram for malignant neoplasm of breast: Secondary | ICD-10-CM

## 2018-04-26 DIAGNOSIS — Z23 Encounter for immunization: Secondary | ICD-10-CM

## 2018-04-26 DIAGNOSIS — Z1239 Encounter for other screening for malignant neoplasm of breast: Secondary | ICD-10-CM

## 2018-04-26 DIAGNOSIS — K089 Disorder of teeth and supporting structures, unspecified: Secondary | ICD-10-CM

## 2018-04-26 DIAGNOSIS — I10 Essential (primary) hypertension: Secondary | ICD-10-CM

## 2018-04-26 DIAGNOSIS — Z1211 Encounter for screening for malignant neoplasm of colon: Secondary | ICD-10-CM

## 2018-04-26 MED ORDER — AMLODIPINE BESYLATE 10 MG PO TABS: 10.0000 mg | ORAL_TABLET | Freq: Every day | ORAL | 11 refills | Status: DC

## 2018-04-26 MED ORDER — METOPROLOL TARTRATE 100 MG PO TABS: 100.0000 mg | ORAL_TABLET | Freq: Two times a day (BID) | ORAL | 11 refills | Status: DC

## 2018-04-26 NOTE — Progress Notes (Signed)
Subjective:  Patient ID: Tracey Ramos, female    DOB: 12-30-1955  Age: 62 y.o. MRN: [id number]  CC: HTN f/u  HPI Tracey Ramos is a 62 y.o. female with a medical history of CKD2, HTN, hernia, and cholelithiasis presents on f/u of HTN. Last BP 130/82 mmHg. BP 118/77 mmHg today. Pt is feeling well. Does not report any symptoms whatsoever. Taking medications as directed.       Outpatient Medications Prior to Visit  Medication Sig Dispense Refill  . amLODipine (NORVASC) 10 MG tablet Take 1 tablet (10 mg total) by mouth daily. 30 tablet 3  . metoprolol tartrate (LOPRESSOR) 100 MG tablet Take 1 tablet (100 mg total) by mouth 2 (two) times daily. 60 tablet 3  . vitamin B-12 (CYANOCOBALAMIN) 250 MCG tablet Take 1 tablet (250 mcg total) by mouth daily. 30 tablet 2  . meclizine (ANTIVERT) 12.5 MG tablet Take 1 tablet (12.5 mg total) by mouth 3 (three) times daily as needed for dizziness. (Patient not taking: Reported on 03/25/2018) 30 tablet 0   No facility-administered medications prior to visit.      ROS Review of Systems  Constitutional: Negative for chills, fever and malaise/fatigue.  Eyes: Negative for blurred vision.  Respiratory: Negative for shortness of breath.   Cardiovascular: Negative for chest pain and palpitations.  Gastrointestinal: Negative for abdominal pain and nausea.  Genitourinary: Negative for dysuria and hematuria.  Musculoskeletal: Negative for joint pain and myalgias.  Skin: Negative for rash.  Neurological: Negative for tingling and headaches.  Psychiatric/Behavioral: Negative for depression. The patient is not nervous/anxious.     Objective:  BP 118/77 (BP Location: Right Arm, Patient Position: Sitting, Cuff Size: Normal)   Pulse 83   Temp (!) 97.4 F (36.3 C) (Oral)   Ht 5' 6.5" (1.689 m)   Wt 184 lb (83.5 kg)   SpO2 98%   BMI 29.25 kg/m   BP/Weight 04/26/2018 03/25/2018 03/09/2018  Systolic BP 118 130 117  Diastolic BP 77 82 83  Wt. (Lbs) 184  186.2 185.9  BMI 29.25 29.6 30.94      Physical Exam  Constitutional: She is oriented to person, place, and time.  Well developed, well nourished, NAD, polite  HENT:  Head: Normocephalic and atraumatic.  Poor dentition  Eyes: No scleral icterus.  Neck: Normal range of motion. Neck supple. No thyromegaly present.  Cardiovascular: Normal rate, regular rhythm and normal heart sounds.  No carotid bruit bilaterally. No LE edema bilaterally.  Pulmonary/Chest: Effort normal and breath sounds normal.  Musculoskeletal: She exhibits no edema.  Neurological: She is alert and oriented to person, place, and time.  Skin: Skin is warm and dry. No rash noted. No erythema. No pallor.  Psychiatric: She has a normal mood and affect. Her behavior is normal. Thought content normal.  Vitals reviewed.    Assessment & Plan:    1. Hypertension, unspecified type - Well controlled with Metroprolol and Amlodipine. - Refill metoprolol tartrate (LOPRESSOR) 100 MG tablet; Take 1 tablet (100 mg total) by mouth 2 (two) times daily.  Dispense: 60 tablet; Refill: 11 - Refill amLODipine (NORVASC) 10 MG tablet; Take 1 tablet (10 mg total) by mouth daily.  Dispense: 30 tablet; Refill: 11  2. Screening for colon cancer - Fecal occult blood, imunochemical  3. Screening for breast cancer - MS DIGITAL SCREENING BILATERAL; Future  4. Need for Tdap vaccination - Tdap vaccine greater than or equal to 7yo IM  5. Poor dentition - Gave patient information to [facility]  D[facility] Access [facility]. She is already eligible since she has the orange card.    Meds ordered this encounter  Medications  . metoprolol tartrate (LOPRESSOR) 100 MG tablet    Sig: Take 1 tablet (100 mg total) by mouth 2 (two) times daily.    Dispense:  60 tablet    Refill:  11    Order Specific Question:   Supervising Provider    Answer:   Quentin Angst [id number]  . amLODipine (NORVASC) 10 MG tablet    Sig: Take 1 tablet (10 mg total) by  mouth daily.    Dispense:  30 tablet    Refill:  11    Order Specific Question:   Supervising Provider    Answer:   Quentin Angst [[id number]]    Follow-up: Return if symptoms worsen or fail to improve, for PAP.   Loletta Specter PA

## 2018-04-26 NOTE — Patient Instructions (Signed)

## 2018-04-30 LAB — FECAL OCCULT BLOOD, IMMUNOCHEMICAL: Fecal Occult Bld: NEGATIVE

## 2018-05-04 ENCOUNTER — Telehealth (INDEPENDENT_AMBULATORY_CARE_PROVIDER_SITE_OTHER): Payer: Self-pay

## 2018-05-04 NOTE — Telephone Encounter (Signed)
Patients voicemail box not setup yet, called her daughter and informed of negative FIT, she will inform patient. Maryjean Morn, CMA

## 2018-05-04 NOTE — Telephone Encounter (Signed)
-----   Message from Loletta Specter, PA-C sent at 05/02/2018  5:50 PM EDT ----- Negative FIT

## 2018-05-06 MED FILL — ?METOPROLOL 100 MG TABLET: 100 | 30 days supply | Qty: 60 | Fill #2

## 2018-05-06 MED FILL — AMLODIPINE BESYLATE 10 MG T: 10 | 30 days supply | Qty: 30 | Fill #2

## 2018-05-31 ENCOUNTER — Other Ambulatory Visit (HOSPITAL_COMMUNITY)
Admission: RE | Admit: 2018-05-31 | Discharge: 2018-05-31 | Disposition: A | Discharge status: Home / Self Care | Payer: Self-pay | Source: Ambulatory Visit | Attending: Physician Assistant | Admitting: Physician Assistant

## 2018-05-31 ENCOUNTER — Other Ambulatory Visit: Payer: Self-pay

## 2018-05-31 ENCOUNTER — Encounter (INDEPENDENT_AMBULATORY_CARE_PROVIDER_SITE_OTHER): Payer: Self-pay | Admitting: Physician Assistant

## 2018-05-31 ENCOUNTER — Ambulatory Visit (INDEPENDENT_AMBULATORY_CARE_PROVIDER_SITE_OTHER): Payer: Self-pay | Admitting: Physician Assistant

## 2018-05-31 VITALS — BP 156/83 | HR 64 | Temp 97.7°F | Ht 66.5 in | Wt 182.0 lb

## 2018-05-31 DIAGNOSIS — Z124 Encounter for screening for malignant neoplasm of cervix: Secondary | ICD-10-CM | POA: Insufficient documentation

## 2018-05-31 DIAGNOSIS — R87610 Atypical squamous cells of undetermined significance on cytologic smear of cervix (ASC-US): Secondary | ICD-10-CM | POA: Insufficient documentation

## 2018-05-31 DIAGNOSIS — B9689 Other specified bacterial agents as the cause of diseases classified elsewhere: Secondary | ICD-10-CM | POA: Insufficient documentation

## 2018-05-31 DIAGNOSIS — R87810 Cervical high risk human papillomavirus (HPV) DNA test positive: Secondary | ICD-10-CM | POA: Insufficient documentation

## 2018-05-31 NOTE — Patient Instructions (Signed)

## 2018-05-31 NOTE — Progress Notes (Signed)
   Subjective:  Patient ID: Tracey Ramos, female    DOB: 04/13/1956  Age: 62 y.o. MRN: [id number]  CC: GYN exam  HPI Tracey Marshallis a 62 y.o.femalewith a medical history of CKD2, HTN, hernia, and cholelithiasis presents for a PAP smear. Does not complain of any pelvic pain, dyspareunia, AUB, genital lesions, or urinary symptoms.      Outpatient Medications Prior to Visit  Medication Sig Dispense Refill  . amLODipine (NORVASC) 10 MG tablet Take 1 tablet (10 mg total) by mouth daily. 30 tablet 11  . metoprolol tartrate (LOPRESSOR) 100 MG tablet Take 1 tablet (100 mg total) by mouth 2 (two) times daily. 60 tablet 11  . vitamin B-12 (CYANOCOBALAMIN) 250 MCG tablet Take 1 tablet (250 mcg total) by mouth daily. 30 tablet 2   No facility-administered medications prior to visit.      ROS Review of Systems  Constitutional: Negative for chills, fever and malaise/fatigue.  Eyes: Negative for blurred vision.  Respiratory: Negative for shortness of breath.   Cardiovascular: Negative for chest pain and palpitations.  Gastrointestinal: Negative for abdominal pain and nausea.  Genitourinary: Negative for dysuria and hematuria.  Musculoskeletal: Negative for joint pain and myalgias.  Skin: Negative for rash.  Neurological: Negative for tingling and headaches.  Psychiatric/Behavioral: Negative for depression. The patient is not nervous/anxious.     Objective:  BP (!) 156/83 (BP Location: Left Arm, Patient Position: Sitting, Cuff Size: Normal)   Pulse 64   Temp 97.7 F (36.5 C) (Oral)   Ht 5' 6.5" (1.689 m)   Wt 182 lb (82.6 kg)   SpO2 96%   BMI 28.94 kg/m   BP/Weight 05/31/2018 04/26/2018 03/25/2018  Systolic BP 156 118 130  Diastolic BP 83 77 82  Wt. (Lbs) 182 184 186.2  BMI 28.94 29.25 29.6      Physical Exam  Constitutional: She is oriented to person, place, and time.  Well developed, well nourished, NAD, polite  HENT:  Head: Normocephalic and atraumatic.  Eyes: No  scleral icterus.  Neck: Normal range of motion. Neck supple. No thyromegaly present.  Cardiovascular: Normal rate, regular rhythm and normal heart sounds.  Pulmonary/Chest: Effort normal and breath sounds normal.  Abdominal: Soft. Bowel sounds are normal. There is no tenderness.  Genitourinary:  Genitourinary Comments: Vulva without lesions, vagina without discharge, normal ruggae, cervix with punctate erythematous spotting, no cervical motion tenderness, no adnexal mass or tenderness bilaterally, no uterine mass or tenderness.   Musculoskeletal: She exhibits no edema.  Neurological: She is alert and oriented to person, place, and time.  Skin: Skin is warm and dry. No rash noted. No erythema. No pallor.  Psychiatric: She has a normal mood and affect. Her behavior is normal. Thought content normal.  Vitals reviewed.    Assessment & Plan:    1. Screening for cervical cancer - Cytology - PAP([place])      Follow-up: Return if symptoms worsen or fail to improve.   Loletta Specteroger David Gomez PA

## 2018-06-06 ENCOUNTER — Other Ambulatory Visit (INDEPENDENT_AMBULATORY_CARE_PROVIDER_SITE_OTHER): Payer: Self-pay | Admitting: Physician Assistant

## 2018-06-06 DIAGNOSIS — R87810 Cervical high risk human papillomavirus (HPV) DNA test positive: Principal | ICD-10-CM

## 2018-06-06 DIAGNOSIS — N760 Acute vaginitis: Secondary | ICD-10-CM

## 2018-06-06 DIAGNOSIS — R87610 Atypical squamous cells of undetermined significance on cytologic smear of cervix (ASC-US): Secondary | ICD-10-CM

## 2018-06-06 DIAGNOSIS — B9689 Other specified bacterial agents as the cause of diseases classified elsewhere: Secondary | ICD-10-CM

## 2018-06-06 LAB — CYTOLOGY - PAP
Bacterial vaginitis: POSITIVE — AB
CANDIDA VAGINITIS: NEGATIVE
CHLAMYDIA, DNA PROBE: NEGATIVE
Diagnosis: UNDETERMINED — AB
HPV: DETECTED — AB
NEISSERIA GONORRHEA: NEGATIVE
Trichomonas: NEGATIVE

## 2018-06-06 MED ORDER — METRONIDAZOLE 500 MG PO TABS: 500.0000 mg | ORAL_TABLET | Freq: Two times a day (BID) | ORAL | 0 refills | Status: AC

## 2018-06-06 MED FILL — ?METOPROLOL 100 MG TABLET: 100 | 30 days supply | Qty: 60 | Fill #3

## 2018-06-06 MED FILL — AMLODIPINE BESYLATE 10 MG T: 10 | 30 days supply | Qty: 30 | Fill #3

## 2018-06-07 ENCOUNTER — Telehealth (INDEPENDENT_AMBULATORY_CARE_PROVIDER_SITE_OTHER): Payer: Self-pay

## 2018-06-07 ENCOUNTER — Encounter (INDEPENDENT_AMBULATORY_CARE_PROVIDER_SITE_OTHER): Payer: Self-pay | Admitting: Physician Assistant

## 2018-06-07 MED FILL — ?METRONIDAZOLE 500MG TABS: 500 | 7 days supply | Qty: 14 | Fill #0

## 2018-06-07 NOTE — Telephone Encounter (Signed)
-----   Message from Loletta Specteroger David Gomez, PA-C sent at 06/06/2018  5:36 PM EDT ----- PAP abnormal, will need to go to GYN. I have made referral. Also, she has BV, I will send abx to [facility].

## 2018-06-07 NOTE — Telephone Encounter (Signed)
Patient is aware of abnormal pap, and referral placed to GYN. She is aware that pap is positive for BV and abx has been sent to [facility]. Patient knows that she will be contacted directly by GYN to schedule appointment. Maryjean Mornempestt S Roberts, CMA

## 2018-06-24 ENCOUNTER — Ambulatory Visit
Admission: RE | Admit: 2018-06-24 | Discharge: 2018-06-24 | Disposition: A | Discharge status: Home / Self Care | Payer: No Typology Code available for payment source | Source: Ambulatory Visit | Attending: Physician Assistant | Admitting: Physician Assistant

## 2018-06-24 DIAGNOSIS — Z1239 Encounter for other screening for malignant neoplasm of breast: Secondary | ICD-10-CM

## 2018-07-29 MED FILL — AMLODIPINE BESYLATE 10 MG T: 10 | 30 days supply | Qty: 30 | Fill #0 | Status: TO

## 2018-07-29 MED FILL — ?METOPROLOL 100 MG TABLET: 100 | 30 days supply | Qty: 60 | Fill #0 | Status: TO

## 2019-05-15 ENCOUNTER — Encounter: Payer: Self-pay | Admitting: Primary Care

## 2019-05-15 ENCOUNTER — Other Ambulatory Visit: Payer: Self-pay

## 2019-05-15 ENCOUNTER — Ambulatory Visit: Payer: Self-pay | Attending: Primary Care | Admitting: Primary Care

## 2019-05-15 VITALS — BP 116/72

## 2019-05-15 DIAGNOSIS — I10 Essential (primary) hypertension: Secondary | ICD-10-CM

## 2019-05-15 DIAGNOSIS — Z760 Encounter for issue of repeat prescription: Secondary | ICD-10-CM

## 2019-05-15 NOTE — Progress Notes (Signed)
Patient verified DOB Patient has taken medication today. Patient has eaten today. Patient denies pain at this time. Patient needs refills on BP medication.

## 2019-05-15 NOTE — Progress Notes (Signed)
Virtual Visit via Telephone Note  I connected with Tracey Ramos on 05/15/19 at  9:25 AM EDT by telephone and verified that I am speaking with the correct person using two identifiers.   I discussed the limitations, risks, security and privacy concerns of performing an evaluation and management service by telephone and the availability of in person appointments. I also discussed with the patient that there may be a patient responsible charge related to this service. The patient expressed understanding and agreed to proceed.   History of Present Illness:  Tracey Ramos is being seen for medications refills she has been in [place] for the last 3 months. Past   medical history of CKD2, HTN, hernia, and cholelithiasis .    Observations/Objective: Bp 112/64 self reported Review of Systems  Constitutional: Negative.   HENT: Negative.   Eyes: Negative.   Respiratory: Negative.   Cardiovascular: Negative.   Gastrointestinal: Negative.   Genitourinary: Negative.   Musculoskeletal: Negative.   Skin: Negative.   Neurological: Negative.   Endo/Heme/Allergies: Negative.   Psychiatric/Behavioral: Negative.      Assessment and Plan: Tracey Ramos was seen today for medication refill.  Diagnoses and all orders for this visit:  Essential hypertension This is a chronic problem. The current episode started more than 1 year ago. The problem has been rapidly improving since onset. The problem is controlled. Associated symptoms include anxiety. (COVID staying in the house ) There are no associated agents to hypertension. There are no known risk factors for coronary artery disease. Past treatments include calcium channel blockers and diuretics. The current treatment provides significant improvement. There are no compliance problems.  Hypertensive end-organ damage includes kidney disease.    Medication refill   This is a chronic problem. The current episode started more than 1 year ago. The problem occurs  daily. The problem has been rapidly improving. Nothing aggravates the symptoms. The treatment provided significant relief.     Follow Up Instructions:    I discussed the assessment and treatment plan with the patient. The patient was provided an opportunity to ask questions and all were answered. The patient agreed with the plan and demonstrated an understanding of the instructions.   The patient was advised to call back or seek an in-person evaluation if the symptoms worsen or if the condition fails to improve as anticipated.  I provided 12 minutes of non-face-to-face time during this encounter.   Grayce Sessions, NP

## 2019-05-16 ENCOUNTER — Other Ambulatory Visit: Payer: Self-pay | Admitting: Pharmacist

## 2019-05-16 DIAGNOSIS — I10 Essential (primary) hypertension: Secondary | ICD-10-CM

## 2019-05-16 MED ORDER — AMLODIPINE BESYLATE 10 MG PO TABS: 10.0000 mg | ORAL_TABLET | Freq: Every day | ORAL | 2 refills | Status: DC

## 2019-05-18 ENCOUNTER — Other Ambulatory Visit (INDEPENDENT_AMBULATORY_CARE_PROVIDER_SITE_OTHER): Payer: Self-pay | Admitting: Family Medicine

## 2019-05-18 ENCOUNTER — Other Ambulatory Visit: Payer: Self-pay

## 2019-05-18 DIAGNOSIS — I10 Essential (primary) hypertension: Secondary | ICD-10-CM

## 2019-05-18 MED ORDER — METOPROLOL TARTRATE 100 MG PO TABS: 100.0000 mg | ORAL_TABLET | Freq: Two times a day (BID) | ORAL | 2 refills | Status: DC

## 2019-06-12 ENCOUNTER — Telehealth (INDEPENDENT_AMBULATORY_CARE_PROVIDER_SITE_OTHER): Payer: Self-pay | Admitting: Physician Assistant

## 2019-06-12 NOTE — Telephone Encounter (Signed)
Patient called stating she need RX for amLODipine (NORVASC) 10 MG tablet  To be sent to   [facility] [address] [place] [address]  Patient would the RX for 90 days.   Please advise [phone]  Thank you Emmit Pomfret

## 2019-06-13 NOTE — Telephone Encounter (Signed)
Called and informed patient she would need to have [facility] in [place] and request a transfer of the medication. PCP can not send directly to [facility] in [facility]. Nat Christen, CMA

## 2019-09-07 ENCOUNTER — Other Ambulatory Visit: Payer: Self-pay

## 2019-09-07 ENCOUNTER — Encounter (INDEPENDENT_AMBULATORY_CARE_PROVIDER_SITE_OTHER): Payer: Self-pay | Admitting: Primary Care

## 2019-09-07 ENCOUNTER — Telehealth (INDEPENDENT_AMBULATORY_CARE_PROVIDER_SITE_OTHER): Payer: No Typology Code available for payment source | Admitting: Primary Care

## 2019-09-07 DIAGNOSIS — I10 Essential (primary) hypertension: Secondary | ICD-10-CM

## 2019-09-07 DIAGNOSIS — N182 Chronic kidney disease, stage 2 (mild): Secondary | ICD-10-CM

## 2019-09-07 DIAGNOSIS — Z760 Encounter for issue of repeat prescription: Secondary | ICD-10-CM

## 2019-09-07 DIAGNOSIS — K089 Disorder of teeth and supporting structures, unspecified: Secondary | ICD-10-CM

## 2019-09-07 DIAGNOSIS — N179 Acute kidney failure, unspecified: Secondary | ICD-10-CM

## 2019-09-07 MED ORDER — METOPROLOL TARTRATE 100 MG PO TABS: 100.0000 mg | ORAL_TABLET | Freq: Two times a day (BID) | ORAL | 1 refills | Status: DC

## 2019-09-07 MED ORDER — AMLODIPINE BESYLATE 10 MG PO TABS: 10.0000 mg | ORAL_TABLET | Freq: Every day | ORAL | 1 refills | Status: DC

## 2019-09-07 NOTE — Progress Notes (Signed)
Virtual Visit via Telephone Note  I connected with Tracey Ramos on 09/07/19 at  9:50 AM EDT by telephone and verified that I am speaking with the correct person using two identifiers.   I discussed the limitations, risks, security and privacy concerns of performing an evaluation and management service by telephone and the availability of in person appointments. I also discussed with the patient that there may be a patient responsible charge related to this service. The patient expressed understanding and agreed to proceed.  WEB VISIT History of Present Illness: Tracey Ramos is being seen for web visit medications refills she remains  in [place] watching her grandchildren while her daughter works. She was reading her my chart and question about her having CKD and asked for explanation.  Past Medical History:  Diagnosis Date  . Cholelithiasis   . CKD (chronic kidney disease), stage II   . Hernia of abdominal wall   . Hypertension    Observations/Objective: Review of Systems  All other systems reviewed and are negative.   Assessment and Plan: Tracey Ramos was seen today for follow-up.  Diagnoses and all orders for this visit:  Poor dentition Several missing teeth not impeding her ability to eat and no s/s of infection.  Hypertension, unspecified type Counseled on blood pressure goal of less than 130/80, low-sodium, DASH diet, medication compliance, 150 minutes of moderate intensity exercise per week. Discussed medication compliance, adverse effects. -     amLODipine (NORVASC) 10 MG tablet; Take 1 tablet (10 mg total) by mouth daily. -     metoprolol tartrate (LOPRESSOR) 100 MG tablet; Take 1 tablet (100 mg total) by mouth 2 (two) times daily.  Acute renal failure superimposed on stage 2 chronic kidney disease, unspecified acute renal failure type (HCC) Signs of kidney damage required 4 stages 1 and 2, include elevated albumin-creatinine ratio and can also include casts, hematuria and  abnormalities noted on imaging.  Rise in incidence of acute kidney injury and chronic kidney disease is due to increase in older patients and increase in probability of diabetes and hypertension  Medication refill tension, unspecified type -     amLODipine (NORVASC) 10 MG tablet; Take 1 tablet (10 mg total) by mouth daily. -     metoprolol tartrate (LOPRESSOR) 100 MG tablet; Take 1 tablet (100 mg total) by mouth 2 (two) times daily.     Follow Up Instructions:    I discussed the assessment and treatment plan with the patient. The patient was provided an opportunity to ask questions and all were answered. The patient agreed with the plan and demonstrated an understanding of the instructions.   The patient was advised to call back or seek an in-person evaluation if the symptoms worsen or if the condition fails to improve as anticipated.  I provided 20 minutes of face-to-face time during this encounter.   Kerin Perna, NP

## 2019-09-07 NOTE — Progress Notes (Signed)
BP reading- 107/73  119/77 at 8:pt is currently in [place] assisting daughter with child care due to Covid. Please send refills to [facility] on [address]. Pt will have Rx transferred to [facility] in [place].

## 2019-12-11 ENCOUNTER — Encounter (INDEPENDENT_AMBULATORY_CARE_PROVIDER_SITE_OTHER): Payer: Self-pay | Admitting: Primary Care

## 2019-12-11 ENCOUNTER — Other Ambulatory Visit: Payer: Self-pay

## 2019-12-11 ENCOUNTER — Ambulatory Visit (INDEPENDENT_AMBULATORY_CARE_PROVIDER_SITE_OTHER): Payer: No Typology Code available for payment source | Admitting: Primary Care

## 2019-12-11 DIAGNOSIS — R2241 Localized swelling, mass and lump, right lower limb: Secondary | ICD-10-CM

## 2019-12-11 DIAGNOSIS — I10 Essential (primary) hypertension: Secondary | ICD-10-CM

## 2019-12-11 DIAGNOSIS — R2242 Localized swelling, mass and lump, left lower limb: Secondary | ICD-10-CM

## 2019-12-11 MED ORDER — AMLODIPINE BESYLATE 10 MG PO TABS: 10.0000 mg | ORAL_TABLET | Freq: Every day | ORAL | 1 refills | Status: DC

## 2019-12-11 MED ORDER — METOPROLOL TARTRATE 100 MG PO TABS: 100.0000 mg | ORAL_TABLET | Freq: Two times a day (BID) | ORAL | 1 refills | Status: DC

## 2019-12-11 MED FILL — AMLODIPINE BESYLATE 10 MG T: 10 | 30 days supply | Qty: 30 | Fill #0

## 2019-12-11 MED FILL — METOPROLOL TARTRATE 100 MG: 100 | 30 days supply | Qty: 60 | Fill #0

## 2019-12-11 NOTE — Progress Notes (Signed)
120/70 this AM left arm

## 2019-12-11 NOTE — Progress Notes (Signed)
Virtual Visit via Telephone Note  I connected with Tracey Ramos on 12/11/19 at  9:10 AM EST by telephone and verified that I am speaking with the correct person using two identifiers.   I discussed the limitations, risks, security and privacy concerns of performing an evaluation and management service by telephone and the availability of in person appointments. I also discussed with the patient that there may be a patient responsible charge related to this service. The patient expressed understanding and agreed to proceed.  History of Present Illness: Tracey Ramos is having a tele visit for ankle swelling bilateral. Bp is unremarkable . She denies shortness of breath, headaches or  chest pain. She is having  lower extremity edema( bilateral ankle swelling).    Past Medical History:  Diagnosis Date  . Cholelithiasis   . CKD (chronic kidney disease), stage II   . Hernia of abdominal wall   . Hypertension    Current Outpatient Medications on File Prior to Visit  Medication Sig Dispense Refill  . amLODipine (NORVASC) 10 MG tablet Take 1 tablet (10 mg total) by mouth daily. 90 tablet 1  . metoprolol tartrate (LOPRESSOR) 100 MG tablet Take 1 tablet (100 mg total) by mouth 2 (two) times daily. 180 tablet 1   No current facility-administered medications on file prior to visit.   Observations/Objective: Review of Systems  Musculoskeletal:       Swelling  All other systems reviewed and are negative.  Assessment and Plan: Tracey Ramos was seen today for edema.  Diagnoses and all orders for this visit:  Hypertension, unspecified type Blood pressure is well controlled on current medication see below. A side affect of amlodipine is swelling. She is not able to rule out diet taking a part of the swelling as she is cooking bacon.  Discussed decreasing sodium intake if ankles continue to swell will change amlodipine or have her to wear ted hose. -     amLODipine (NORVASC) 10 MG tablet; Take 1  tablet (10 mg total) by mouth daily. -     metoprolol tartrate (LOPRESSOR) 100 MG tablet; Take 1 tablet (100 mg total) by mouth 2 (two) times daily.    Follow Up Instructions:    I discussed the assessment and treatment plan with the patient. The patient was provided an opportunity to ask questions and all were answered. The patient agreed with the plan and demonstrated an understanding of the instructions.   The patient was advised to call back or seek an in-person evaluation if the symptoms worsen or if the condition fails to improve as anticipated.  I provided 14 minutes of non-face-to-face time during this encounter.   Kerin Perna, NP

## 2019-12-18 ENCOUNTER — Telehealth (INDEPENDENT_AMBULATORY_CARE_PROVIDER_SITE_OTHER): Payer: Self-pay

## 2019-12-18 NOTE — Telephone Encounter (Signed)
Patient called wanting a call back from PCP. Wants to know if it is possible for her to have gout. States she forgot to ask the day of her appointment.  Please advice [phone]  Patient called from [phone]

## 2019-12-19 NOTE — Telephone Encounter (Signed)
Patient is aware that only way to determine is with a blood draw; uric acid. Marland Kitchen

## 2020-01-01 MED FILL — METOPROLOL TARTRATE 100 MG: 100 | 30 days supply | Qty: 60 | Fill #0

## 2020-01-01 MED FILL — AMLODIPINE BESYLATE 10 MG T: 10 | 30 days supply | Qty: 30 | Fill #0

## 2020-01-05 ENCOUNTER — Ambulatory Visit (INDEPENDENT_AMBULATORY_CARE_PROVIDER_SITE_OTHER): Payer: No Typology Code available for payment source | Admitting: Primary Care

## 2020-01-16 MED FILL — AMLODIPINE BESYLATE 10 MG T: 10 | 30 days supply | Qty: 30 | Fill #0

## 2020-04-08 ENCOUNTER — Ambulatory Visit (INDEPENDENT_AMBULATORY_CARE_PROVIDER_SITE_OTHER): Payer: No Typology Code available for payment source | Admitting: Primary Care

## 2020-04-12 ENCOUNTER — Ambulatory Visit (INDEPENDENT_AMBULATORY_CARE_PROVIDER_SITE_OTHER): Payer: No Typology Code available for payment source | Admitting: Primary Care

## 2020-04-26 ENCOUNTER — Ambulatory Visit (INDEPENDENT_AMBULATORY_CARE_PROVIDER_SITE_OTHER): Payer: No Typology Code available for payment source | Admitting: Primary Care

## 2021-10-03 ENCOUNTER — Encounter (HOSPITAL_BASED_OUTPATIENT_CLINIC_OR_DEPARTMENT_OTHER): Payer: Self-pay

## 2021-10-03 ENCOUNTER — Emergency Department (HOSPITAL_BASED_OUTPATIENT_CLINIC_OR_DEPARTMENT_OTHER)
Admission: EM | Admit: 2021-10-03 | Discharge: 2021-10-03 | Disposition: A | Discharge status: Home / Self Care | Payer: Medicare Other | Attending: Emergency Medicine | Admitting: Emergency Medicine

## 2021-10-03 ENCOUNTER — Other Ambulatory Visit: Payer: Self-pay

## 2021-10-03 DIAGNOSIS — R631 Polydipsia: Secondary | ICD-10-CM | POA: Diagnosis present

## 2021-10-03 DIAGNOSIS — I129 Hypertensive chronic kidney disease with stage 1 through stage 4 chronic kidney disease, or unspecified chronic kidney disease: Secondary | ICD-10-CM | POA: Diagnosis not present

## 2021-10-03 DIAGNOSIS — Z87891 Personal history of nicotine dependence: Secondary | ICD-10-CM | POA: Insufficient documentation

## 2021-10-03 DIAGNOSIS — Z79899 Other long term (current) drug therapy: Secondary | ICD-10-CM | POA: Diagnosis not present

## 2021-10-03 DIAGNOSIS — R000 Tachycardia, unspecified: Secondary | ICD-10-CM | POA: Diagnosis not present

## 2021-10-03 DIAGNOSIS — F3162 Bipolar disorder, current episode mixed, moderate: Secondary | ICD-10-CM | POA: Diagnosis not present

## 2021-10-03 DIAGNOSIS — E860 Dehydration: Secondary | ICD-10-CM | POA: Diagnosis not present

## 2021-10-03 DIAGNOSIS — N182 Chronic kidney disease, stage 2 (mild): Secondary | ICD-10-CM | POA: Insufficient documentation

## 2021-10-03 LAB — URINALYSIS, ROUTINE W REFLEX MICROSCOPIC
Glucose, UA: NEGATIVE mg/dL
Hgb urine dipstick: NEGATIVE
Ketones, ur: 15 mg/dL — AB
Leukocytes,Ua: NEGATIVE
Nitrite: NEGATIVE
Specific Gravity, Urine: 1.025 (ref 1.005–1.030)
pH: 6 (ref 5.0–8.0)

## 2021-10-03 LAB — COMPREHENSIVE METABOLIC PANEL
ALT: 33 U/L (ref 0–44)
AST: 32 U/L (ref 15–41)
Albumin: 4.6 g/dL (ref 3.5–5.0)
Alkaline Phosphatase: 72 U/L (ref 38–126)
Anion gap: 9 (ref 5–15)
BUN: 13 mg/dL (ref 8–23)
CO2: 26 mmol/L (ref 22–32)
Calcium: 9.9 mg/dL (ref 8.9–10.3)
Chloride: 102 mmol/L (ref 98–111)
Creatinine, Ser: 1.15 mg/dL — ABNORMAL HIGH (ref 0.44–1.00)
GFR, Estimated: 53 mL/min — ABNORMAL LOW (ref >60)
Glucose, Bld: 85 mg/dL (ref 70–99)
Potassium: 4 mmol/L (ref 3.5–5.1)
Sodium: 137 mmol/L (ref 135–145)
Total Bilirubin: 0.6 mg/dL (ref 0.3–1.2)
Total Protein: 8.5 g/dL — ABNORMAL HIGH (ref 6.5–8.1)

## 2021-10-03 LAB — CBC WITH DIFFERENTIAL/PLATELET
Abs Immature Granulocytes: 0.05 10*3/uL (ref 0.00–0.07)
Basophils Absolute: 0 10*3/uL (ref 0.0–0.1)
Basophils Relative: 0 %
Eosinophils Absolute: 0.2 10*3/uL (ref 0.0–0.5)
Eosinophils Relative: 1 %
HCT: 44.1 % (ref 36.0–46.0)
Hemoglobin: 14.5 g/dL (ref 12.0–15.0)
Immature Granulocytes: 1 %
Lymphocytes Relative: 26 %
Lymphs Abs: 2.8 10*3/uL (ref 0.7–4.0)
MCH: 31.3 pg (ref 26.0–34.0)
MCHC: 32.9 g/dL (ref 30.0–36.0)
MCV: 95.2 fL (ref 80.0–100.0)
Monocytes Absolute: 0.8 10*3/uL (ref 0.1–1.0)
Monocytes Relative: 7 %
Neutro Abs: 7.1 10*3/uL (ref 1.7–7.7)
Neutrophils Relative %: 65 %
Platelets: 238 10*3/uL (ref 150–400)
RBC: 4.63 MIL/uL (ref 3.87–5.11)
RDW: 13 % (ref 11.5–15.5)
WBC: 11 10*3/uL — ABNORMAL HIGH (ref 4.0–10.5)
nRBC: 0 % (ref 0.0–0.2)

## 2021-10-03 LAB — URINALYSIS, MICROSCOPIC (REFLEX)

## 2021-10-03 MED ORDER — SODIUM CHLORIDE 0.9 % IV BOLUS
1000.0000 mL | Freq: Once | INTRAVENOUS | Status: AC
  Administered 2021-10-03: 1000 mL via INTRAVENOUS

## 2021-10-03 NOTE — ED Notes (Signed)
Pt. Calm, watching TV, daughter in room w/pt.

## 2021-10-03 NOTE — Discharge Instructions (Addendum)
Your blood work was without abnormality other than your urine showed that you are dehydrated.  After we gave you some IV fluids you are able to urinate without difficulty.  I encourage you to drink some more water.  I recommend that you begin taking your olanzapine again.  I have attached some resources for psychiatric help in the area for you to follow-up with.  Please return if you have worsening inability to urinate despite adequate hydration, or you begin to develop thoughts of hurting himself or others.

## 2021-10-03 NOTE — ED Provider Notes (Signed)
[facility] EMERGENCY DEPARTMENT Provider Note   CSN: [id number] Arrival date & time: 10/03/21  1424     History Chief Complaint  Patient presents with   Manic Behavior   Urinary Retention    Tracey Ramos is a 65 y.o. female with past medical history significant for chronic kidney disease, and bipolar disorder who presents with 1 month of increased energy, decreased sleep, decreased appetite, thirst and 1 day of inability to void urine.  Patient denies history of inability to void urine, reports that she does feel like she needs to pee, but is currently unable to.  Patient denies burning with urination, nausea, vomiting, constipation, diarrhea, numbness or tingling.  Patient denies any abdominal pain.  Patient denies suicidal ideation or homicidal ideation, she does endorse some vague audio hallucinations.  When pressed about the nature of the audio hallucinations patient reports that she just feels like she is under a lot of pressure.  Of note patient normally takes olanzapine 15 mg daily, however recently stopped taking her medication for no particular reason.  Patient does not have a regular psychiatrist or primary care doctor.  HPI     Past Medical History:  Diagnosis Date   Cholelithiasis    CKD (chronic kidney disease), stage II    Hernia of abdominal wall    Hypertension     Patient Active Problem List   Diagnosis Date Noted   Syncope 03/06/2018   Acute lower UTI 03/06/2018   Hypokalemia 03/06/2018   Acute renal failure superimposed on stage 2 chronic kidney disease (HCC) 03/06/2018   Hypertensive urgency 03/06/2018   Hypertension    Acute cystitis without hematuria    Tachycardia     Past Surgical History:  Procedure Laterality Date   APPENDECTOMY       OB History   No obstetric history on file.     Family History  Problem Relation Age of Onset   Depression Mother    Hypertension Father    Heart attack Father     Social History    Tobacco Use   Smoking status: Former    Types: Cigarettes   Smokeless tobacco: Never  Vaping Use   Vaping Use: Never used  Substance Use Topics   Alcohol use: Not Currently   Drug use: Yes    Types: Marijuana    Home Medications Prior to Admission medications   Medication Sig Start Date End Date Taking? Authorizing Provider  OLANZapine (ZYPREXA) 15 MG tablet Take 15 mg by mouth at bedtime.   Yes [provider]  amLODipine (NORVASC) 10 MG tablet Take 1 tablet (10 mg total) by mouth daily. 12/11/19   Grayce Sessions, NP  metoprolol tartrate (LOPRESSOR) 100 MG tablet Take 1 tablet (100 mg total) by mouth 2 (two) times daily. 12/11/19   Grayce Sessions, NP    Allergies    Penicillins  Review of Systems   Review of Systems  Genitourinary:  Positive for difficulty urinating and urgency. Negative for dysuria.  Psychiatric/Behavioral:  Positive for hallucinations and sleep disturbance. Negative for suicidal ideas. The patient is hyperactive.   All other systems reviewed and are negative.  Physical Exam Updated Vital Signs BP (!) 160/86   Pulse 98   Temp 98.5 F (36.9 C) (Oral)   Resp 15   Ht 5\' 5"  (1.651 m)   Wt 82.1 kg   SpO2 99%   BMI 30.12 kg/m   Physical Exam Vitals and nursing note reviewed.  Constitutional:  General: She is not in acute distress.    Appearance: Normal appearance.  HENT:     Head: Normocephalic and atraumatic.  Eyes:     General:        Right eye: No discharge.        Left eye: No discharge.  Cardiovascular:     Rate and Rhythm: Regular rhythm. Tachycardia present.     Heart sounds: No murmur heard.   No friction rub. No gallop.  Pulmonary:     Effort: Pulmonary effort is normal.     Breath sounds: Normal breath sounds.  Abdominal:     General: Bowel sounds are normal.     Palpations: Abdomen is soft.     Comments: No tenderness to palpation of entire abdominal wall including suprapubic region.  Skin:    General:  Skin is warm and dry.     Capillary Refill: Capillary refill takes less than 2 seconds.  Neurological:     Mental Status: She is alert and oriented to person, place, and time.  Psychiatric:        Behavior: Behavior normal.     Comments: Somewhat flat affect, slow to answering questions.  Patient denies SI/HI.  Patient does endorse some vague AVH, but will not describe specifics when pressed.  Patient follows commands, and is alert and oriented x3.    ED Results / Procedures / Treatments   Labs (all labs ordered are listed, but only abnormal results are displayed) Labs Reviewed  CBC WITH DIFFERENTIAL/PLATELET - Abnormal; Notable for the following components:      Result Value   WBC 11.0 (*)    All other components within normal limits  COMPREHENSIVE METABOLIC PANEL - Abnormal; Notable for the following components:   Creatinine, Ser 1.15 (*)    Total Protein 8.5 (*)    GFR, Estimated 53 (*)    All other components within normal limits  URINALYSIS, ROUTINE W REFLEX MICROSCOPIC - Abnormal; Notable for the following components:   Bilirubin Urine SMALL (*)    Ketones, ur 15 (*)    Protein, ur TRACE (*)    All other components within normal limits  URINALYSIS, MICROSCOPIC (REFLEX) - Abnormal; Notable for the following components:   Bacteria, UA FEW (*)    All other components within normal limits    EKG None  Radiology No results found.  Procedures Procedures   Medications Ordered in ED Medications  sodium chloride 0.9 % bolus 1,000 mL (0 mLs Intravenous Stopped 10/03/21 1729)    ED Course  I have reviewed the triage vital signs and the nursing notes.  Pertinent labs & imaging results that were available during my care of the patient were reviewed by me and considered in my medical decision making (see chart for details).    MDM Rules/Calculators/A&P                         Patient reports inability to urinate x1 day.  However after administration of fluids patient  spontaneously urinates without need for in and out catheterization.  Patient denies pain with urination.  Urinalysis is significant for ketonuria suggestive of dehydration.  This is consistent with family story that patient has not been eating or drinking much.  No concern for acute urinary retention at this time.  Creatinine normal compared to baseline, no evidence of acute kidney injury.  No hemoglobinuria to suggest kidney stone or other abnormality.  Patient does have a notably flat  affect, expresses worry about her mental state.  Patient denies SI/HI, does endorse some vague audiovisual hallucinations.  Patient is agreeable to restarting her olanzapine, and would seek further psychiatric care.  I do not believe patient is a threat to herself or others at this time, shows no evidence of psychotic break.  Do recommend further evaluation for psychiatric help, and encouraged fluid intake.  Patient discharged in stable condition at this time.  Return precautions were given.  Plan was explained in detail to patient's daughter who agrees to the plan as stated and will help her to seek resources. Final Clinical Impression(s) / ED Diagnoses Final diagnoses:  Dehydration  Bipolar 1 disorder, mixed, moderate (HCC)    Rx / DC Orders ED Discharge Orders     None        [place] 10/03/21 1913    Gwyneth Sprout, MD 10/03/21 2310

## 2021-10-03 NOTE — ED Triage Notes (Signed)
Per pt and family member pt with manic behavior x 1 wee-pt states she had been unable to void x today-pt denies SI/HI-states she has not been compliant with bipolar meds-NAD-steady gait

## 2021-10-09 ENCOUNTER — Emergency Department (HOSPITAL_COMMUNITY): Payer: Medicare Other

## 2021-10-09 ENCOUNTER — Emergency Department (HOSPITAL_COMMUNITY)
Admission: EM | Admit: 2021-10-09 | Discharge: 2021-10-10 | Disposition: A | Discharge status: Home / Self Care | Payer: Medicare Other | Attending: Emergency Medicine | Admitting: Emergency Medicine

## 2021-10-09 ENCOUNTER — Encounter (HOSPITAL_COMMUNITY): Payer: Self-pay | Admitting: Student

## 2021-10-09 ENCOUNTER — Other Ambulatory Visit: Payer: Self-pay

## 2021-10-09 DIAGNOSIS — Z87891 Personal history of nicotine dependence: Secondary | ICD-10-CM | POA: Insufficient documentation

## 2021-10-09 DIAGNOSIS — N182 Chronic kidney disease, stage 2 (mild): Secondary | ICD-10-CM | POA: Diagnosis not present

## 2021-10-09 DIAGNOSIS — F419 Anxiety disorder, unspecified: Secondary | ICD-10-CM | POA: Diagnosis not present

## 2021-10-09 DIAGNOSIS — Z79899 Other long term (current) drug therapy: Secondary | ICD-10-CM | POA: Insufficient documentation

## 2021-10-09 DIAGNOSIS — R911 Solitary pulmonary nodule: Secondary | ICD-10-CM

## 2021-10-09 DIAGNOSIS — R0602 Shortness of breath: Secondary | ICD-10-CM | POA: Diagnosis not present

## 2021-10-09 DIAGNOSIS — I129 Hypertensive chronic kidney disease with stage 1 through stage 4 chronic kidney disease, or unspecified chronic kidney disease: Secondary | ICD-10-CM | POA: Insufficient documentation

## 2021-10-09 DIAGNOSIS — K449 Diaphragmatic hernia without obstruction or gangrene: Secondary | ICD-10-CM

## 2021-10-09 LAB — CBC WITH DIFFERENTIAL/PLATELET
Abs Immature Granulocytes: 0.02 10*3/uL (ref 0.00–0.07)
Basophils Absolute: 0 10*3/uL (ref 0.0–0.1)
Basophils Relative: 1 %
Eosinophils Absolute: 0.1 10*3/uL (ref 0.0–0.5)
Eosinophils Relative: 1 %
HCT: 42.9 % (ref 36.0–46.0)
Hemoglobin: 13.9 g/dL (ref 12.0–15.0)
Immature Granulocytes: 0 %
Lymphocytes Relative: 31 %
Lymphs Abs: 2.6 10*3/uL (ref 0.7–4.0)
MCH: 31.7 pg (ref 26.0–34.0)
MCHC: 32.4 g/dL (ref 30.0–36.0)
MCV: 97.7 fL (ref 80.0–100.0)
Monocytes Absolute: 0.6 10*3/uL (ref 0.1–1.0)
Monocytes Relative: 7 %
Neutro Abs: 5 10*3/uL (ref 1.7–7.7)
Neutrophils Relative %: 60 %
Platelets: 274 10*3/uL (ref 150–400)
RBC: 4.39 MIL/uL (ref 3.87–5.11)
RDW: 12.9 % (ref 11.5–15.5)
WBC: 8.4 10*3/uL (ref 4.0–10.5)
nRBC: 0 % (ref 0.0–0.2)

## 2021-10-09 LAB — COMPREHENSIVE METABOLIC PANEL
ALT: 21 U/L (ref 0–44)
AST: 22 U/L (ref 15–41)
Albumin: 4.6 g/dL (ref 3.5–5.0)
Alkaline Phosphatase: 69 U/L (ref 38–126)
Anion gap: 9 (ref 5–15)
BUN: 9 mg/dL (ref 8–23)
CO2: 28 mmol/L (ref 22–32)
Calcium: 9.5 mg/dL (ref 8.9–10.3)
Chloride: 102 mmol/L (ref 98–111)
Creatinine, Ser: 1 mg/dL (ref 0.44–1.00)
GFR, Estimated: >60 mL/min (ref >60)
Glucose, Bld: 95 mg/dL (ref 70–99)
Potassium: 3.4 mmol/L — ABNORMAL LOW (ref 3.5–5.1)
Sodium: 139 mmol/L (ref 135–145)
Total Bilirubin: 0.8 mg/dL (ref 0.3–1.2)
Total Protein: 8.4 g/dL — ABNORMAL HIGH (ref 6.5–8.1)

## 2021-10-09 LAB — BRAIN NATRIURETIC PEPTIDE: B Natriuretic Peptide: 30.6 pg/mL (ref 0.0–100.0)

## 2021-10-09 LAB — LIPASE, BLOOD: Lipase: 25 U/L (ref 11–51)

## 2021-10-09 NOTE — ED Triage Notes (Addendum)
Patient BIB [facility] from home c/o anxiety and SOB.  Patient has a hx of anxiety, depression, and some behavior issue, followed by the act team and has not been taking her meds.  Ongoing for the last month and was seen at med center recently.   EMS reports some odd conversation in route and patient is very afraid that she is going to die today. Patient is A&OX4.  192/94 130 98% room air [id number]

## 2021-10-09 NOTE — ED Provider Notes (Signed)
Emergency Medicine Provider Triage Evaluation Note  Tracey Ramos , a 65 y.o. female  was evaluated in triage.  Pt complains of feeling overwhelmed. Denies SI/HI/AVH. Mild depression patient also has had some stomach pain and constipation. Patient is unsure of what her goal of today's visit is. Mild sob.  Review of Systems  Positive: Abd pain Negative: vomiting  Physical Exam  BP (!) 187/118 (BP Location: Right Arm)   Pulse (!) 115   Temp 99.1 F (37.3 C) (Oral)   Resp 18   Ht 5\' 5"  (1.651 m)   Wt 82 kg   SpO2 97%   BMI 30.08 kg/m  Gen:   Awake, no distress   Resp:  Normal effort  MSK:   Moves extremities without difficulty  Other:  No abd ttp  Medical Decision Making  Medically screening exam initiated at 8:27 PM.  Appropriate orders placed.  Tracey Ramos was informed that the remainder of the evaluation will be completed by another provider, this initial triage assessment does not replace that evaluation, and the importance of remaining in the ED until their evaluation is complete.  Work up initiated.  Has not been taking her blood pressure meds because she his out of them.   Gaynell Face, PA-C 10/09/21 2032    2033, MD 10/09/21 [phone]

## 2021-10-10 ENCOUNTER — Encounter (HOSPITAL_COMMUNITY): Payer: Self-pay | Admitting: Emergency Medicine

## 2021-10-10 ENCOUNTER — Emergency Department (HOSPITAL_COMMUNITY): Payer: Medicare Other

## 2021-10-10 DIAGNOSIS — F419 Anxiety disorder, unspecified: Secondary | ICD-10-CM | POA: Diagnosis not present

## 2021-10-10 LAB — URINALYSIS, ROUTINE W REFLEX MICROSCOPIC
Bilirubin Urine: NEGATIVE
Glucose, UA: NEGATIVE mg/dL
Hgb urine dipstick: NEGATIVE
Ketones, ur: 5 mg/dL — AB
Leukocytes,Ua: NEGATIVE
Nitrite: NEGATIVE
Protein, ur: NEGATIVE mg/dL
Specific Gravity, Urine: 1.013 (ref 1.005–1.030)
pH: 6 (ref 5.0–8.0)

## 2021-10-10 LAB — TROPONIN I (HIGH SENSITIVITY): Troponin I (High Sensitivity): 5 ng/L (ref <18)

## 2021-10-10 MED ORDER — SODIUM CHLORIDE (PF) 0.9 % IJ SOLN
INTRAMUSCULAR | Status: AC
  Filled 2021-10-10: qty 50

## 2021-10-10 MED ORDER — OLANZAPINE 5 MG PO TBDP
5.0000 mg | ORAL_TABLET | Freq: Every day | ORAL | Status: DC
  Administered 2021-10-10: 5 mg via ORAL
  Filled 2021-10-10: qty 1

## 2021-10-10 MED ORDER — IOHEXOL 350 MG/ML SOLN
80.0000 mL | Freq: Once | INTRAVENOUS | Status: AC | PRN
  Administered 2021-10-10: 80 mL via INTRAVENOUS

## 2021-10-10 NOTE — ED Provider Notes (Signed)
[facility]-EMERGENCY DEPT Provider Note   CSN: [id number] Arrival date & time: 10/09/21  1920     History Chief Complaint  Patient presents with   Anxiety   Shortness of Breath   Hypertension    Kathleene Krogh is a 65 y.o. female.  The history is provided by the patient.  Anxiety This is a chronic problem. The current episode started more than 1 week ago. The problem occurs constantly. The problem has not changed since onset.Pertinent negatives include no chest pain. Nothing aggravates the symptoms. Nothing relieves the symptoms. She has tried nothing (has not taken her zyprexa for a while, though she has a prescription and refills) for the symptoms. The treatment provided no relief.  Shortness of Breath Severity:  Moderate Onset quality:  Gradual Duration:  2 days Timing:  Constant Chronicity:  New Context: not URI   Relieved by:  Nothing Worsened by:  Nothing Ineffective treatments:  None tried Associated symptoms: no chest pain, no cough, no fever, no rash, no sore throat, no sputum production, no syncope, no swollen glands, no vomiting and no wheezing   Risk factors: no recent alcohol use and no prolonged immobilization   Patient with HTN and Bipolar who is not taking her medication presents with anxiety and SOB.  No CP, no DOE.  No n/v/d.  No SI nor HI.  No AH or VH.       Past Medical History:  Diagnosis Date   Cholelithiasis    CKD (chronic kidney disease), stage II    Hernia of abdominal wall    Hypertension     Patient Active Problem List   Diagnosis Date Noted   Syncope 03/06/2018   Acute lower UTI 03/06/2018   Hypokalemia 03/06/2018   Acute renal failure superimposed on stage 2 chronic kidney disease (HCC) 03/06/2018   Hypertensive urgency 03/06/2018   Hypertension    Acute cystitis without hematuria    Tachycardia     Past Surgical History:  Procedure Laterality Date   APPENDECTOMY       OB History   No obstetric  history on file.     Family History  Problem Relation Age of Onset   Depression Mother    Hypertension Father    Heart attack Father     Social History   Tobacco Use   Smoking status: Former    Types: Cigarettes   Smokeless tobacco: Never  Vaping Use   Vaping Use: Never used  Substance Use Topics   Alcohol use: Not Currently   Drug use: Yes    Types: Marijuana    Home Medications Prior to Admission medications   Medication Sig Start Date End Date Taking? Authorizing Provider  OLANZapine (ZYPREXA) 15 MG tablet Take 15 mg by mouth at bedtime.   Yes [provider]  amLODipine (NORVASC) 10 MG tablet Take 1 tablet (10 mg total) by mouth daily. Patient not taking: Reported on 10/10/2021 12/11/19   Grayce Sessions, NP  metoprolol tartrate (LOPRESSOR) 100 MG tablet Take 1 tablet (100 mg total) by mouth 2 (two) times daily. Patient not taking: Reported on 10/10/2021 12/11/19   Grayce Sessions, NP    Allergies    Penicillins  Review of Systems   Review of Systems  Constitutional:  Negative for fever.  HENT:  Negative for congestion and sore throat.   Eyes:  Negative for redness.  Respiratory:  Negative for cough, sputum production and wheezing.   Cardiovascular:  Negative for chest  pain, palpitations, leg swelling and syncope.  Gastrointestinal:  Negative for vomiting.  Genitourinary:  Negative for difficulty urinating.  Musculoskeletal:  Negative for neck stiffness.  Skin:  Negative for rash.  Neurological:  Negative for facial asymmetry.  Psychiatric/Behavioral:  Negative for self-injury and suicidal ideas. The patient is nervous/anxious.   All other systems reviewed and are negative.  Physical Exam Updated Vital Signs BP (!) 150/89   Pulse 81   Temp 99.1 F (37.3 C) (Oral)   Resp 17   Ht 5\' 5"  (1.651 m)   Wt 82 kg   SpO2 99%   BMI 30.08 kg/m   Physical Exam Vitals and nursing note reviewed.  Constitutional:      General: She is not in  acute distress.    Appearance: Normal appearance.  HENT:     Head: Normocephalic and atraumatic.     Nose: Nose normal.  Eyes:     Conjunctiva/sclera: Conjunctivae normal.     Pupils: Pupils are equal, round, and reactive to light.  Cardiovascular:     Rate and Rhythm: Normal rate and regular rhythm.     Pulses: Normal pulses.     Heart sounds: Normal heart sounds.  Pulmonary:     Effort: Pulmonary effort is normal.     Breath sounds: Normal breath sounds.  Abdominal:     General: Abdomen is flat. Bowel sounds are normal.     Palpations: Abdomen is soft.     Tenderness: There is no abdominal tenderness. There is no guarding.  Musculoskeletal:        General: No tenderness. Normal range of motion.     Cervical back: Normal range of motion and neck supple.  Skin:    General: Skin is warm and dry.     Capillary Refill: Capillary refill takes less than 2 seconds.  Neurological:     General: No focal deficit present.     Mental Status: She is alert and oriented to person, place, and time.     Deep Tendon Reflexes: Reflexes normal.  Psychiatric:        Mood and Affect: Mood normal. Mood is not anxious. Affect is not labile.        Speech: Speech is not rapid and pressured, delayed or tangential.        Behavior: Behavior normal.        Thought Content: Thought content normal. Thought content does not include homicidal or suicidal ideation. Thought content does not include homicidal or suicidal plan.        Cognition and Memory: Cognition is not impaired.    ED Results / Procedures / Treatments   Labs (all labs ordered are listed, but only abnormal results are displayed) Results for orders placed or performed during the hospital encounter of 10/09/21  CBC with Differential  Result Value Ref Range   WBC 8.4 4.0 - 10.5 K/uL   RBC 4.39 3.87 - 5.11 MIL/uL   Hemoglobin 13.9 12.0 - 15.0 g/dL   HCT 10/11/21 16.1 - 09.6 %   MCV 97.7 80.0 - 100.0 fL   MCH 31.7 26.0 - 34.0 pg   MCHC 32.4  30.0 - 36.0 g/dL   RDW 04.5 40.9 - 81.1 %   Platelets 274 150 - 400 K/uL   nRBC 0.0 0.0 - 0.2 %   Neutrophils Relative % 60 %   Neutro Abs 5.0 1.7 - 7.7 K/uL   Lymphocytes Relative 31 %   Lymphs Abs 2.6 0.7 - 4.0 K/uL  Monocytes Relative 7 %   Monocytes Absolute 0.6 0.1 - 1.0 K/uL   Eosinophils Relative 1 %   Eosinophils Absolute 0.1 0.0 - 0.5 K/uL   Basophils Relative 1 %   Basophils Absolute 0.0 0.0 - 0.1 K/uL   Immature Granulocytes 0 %   Abs Immature Granulocytes 0.02 0.00 - 0.07 K/uL  Urinalysis, Routine w reflex microscopic Urine, Clean Catch  Result Value Ref Range   Color, Urine YELLOW YELLOW   APPearance CLEAR CLEAR   Specific Gravity, Urine 1.013 1.005 - 1.030   pH 6.0 5.0 - 8.0   Glucose, UA NEGATIVE NEGATIVE mg/dL   Hgb urine dipstick NEGATIVE NEGATIVE   Bilirubin Urine NEGATIVE NEGATIVE   Ketones, ur 5 (A) NEGATIVE mg/dL   Protein, ur NEGATIVE NEGATIVE mg/dL   Nitrite NEGATIVE NEGATIVE   Leukocytes,Ua NEGATIVE NEGATIVE  Comprehensive metabolic panel  Result Value Ref Range   Sodium 139 135 - 145 mmol/L   Potassium 3.4 (L) 3.5 - 5.1 mmol/L   Chloride 102 98 - 111 mmol/L   CO2 28 22 - 32 mmol/L   Glucose, Bld 95 70 - 99 mg/dL   BUN 9 8 - 23 mg/dL   Creatinine, Ser 1.85 0.44 - 1.00 mg/dL   Calcium 9.5 8.9 - 63.1 mg/dL   Total Protein 8.4 (H) 6.5 - 8.1 g/dL   Albumin 4.6 3.5 - 5.0 g/dL   AST 22 15 - 41 U/L   ALT 21 0 - 44 U/L   Alkaline Phosphatase 69 38 - 126 U/L   Total Bilirubin 0.8 0.3 - 1.2 mg/dL   GFR, Estimated >49 >70 mL/min   Anion gap 9 5 - 15  Lipase, blood  Result Value Ref Range   Lipase 25 11 - 51 U/L  Brain natriuretic peptide  Result Value Ref Range   B Natriuretic Peptide 30.6 0.0 - 100.0 pg/mL  Troponin I (High Sensitivity)  Result Value Ref Range   Troponin I (High Sensitivity) 5 <18 ng/L   CT Angio Chest PE W and/or Wo Contrast  Result Date: 10/10/2021 CLINICAL DATA:  Shortness of breath. EXAM: CT ANGIOGRAPHY CHEST WITH  CONTRAST TECHNIQUE: Multidetector CT imaging of the chest was performed using the standard protocol during bolus administration of intravenous contrast. Multiplanar CT image reconstructions and MIPs were obtained to evaluate the vascular anatomy. CONTRAST:  54mL OMNIPAQUE IOHEXOL 350 MG/ML SOLN COMPARISON:  Chest radiograph 10/09/2021 FINDINGS: Cardiovascular: Pulmonary arterial opacification is adequate without evidence of emboli. There is no evidence of a thoracic aortic dissection or aneurysm. The heart is normal in size. There is no pericardial effusion. LAD coronary artery atherosclerosis is noted. Mediastinum/Nodes: No enlarged axillary, mediastinal, or hilar lymph nodes. Unremarkable thyroid. Small hiatal hernia. Lungs/Pleura: No pleural effusion or pneumothorax. 3 mm right lower lobe nodule (series 6, image 51). Motion artifact and mild atelectasis predominantly in the lung bases. Upper Abdomen: No acute abnormality. Musculoskeletal: No acute osseous abnormality or suspicious osseous lesion. Review of the MIP images confirms the above findings. IMPRESSION: 1. No evidence of pulmonary emboli or other acute abnormality in the chest. 2. Small hiatal hernia. 3. 3 mm right lower lobe pulmonary nodule. No follow-up needed if patient is low-risk. Non-contrast chest CT can be considered in 12 months if patient is high-risk. This recommendation follows the consensus statement: Guidelines for Management of Incidental Pulmonary Nodules Detected on CT Images: From the Fleischner Society 2017; Radiology 2017; 284:228-243. Electronically Signed   By: Sebastian Ache M.D.   On: 10/10/2021  05:38   DG Abd Acute W/Chest  Result Date: 10/09/2021 CLINICAL DATA:  Lower abdominal pain EXAM: DG ABDOMEN ACUTE WITH 1 VIEW CHEST COMPARISON:  09/14/2012 FINDINGS: Supine and upright frontal views of the abdomen and pelvis as well as an upright frontal view of the chest are obtained. Cardiac silhouette is unremarkable. Lungs are  clear. No effusion or pneumothorax. No bowel obstruction or ileus. Calcified uterine fibroids are seen within the pelvis. Rounded calcifications projecting over the right upper quadrant likely reflect gallstones. No abdominal masses. No free gas in the greater peritoneal sac. No acute bony abnormalities. IMPRESSION: 1. Likely small gallstones projecting over the right upper quadrant. 2. Unremarkable bowel gas pattern. 3. No acute intrathoracic process. Electronically Signed   By: Sharlet Salina M.D.   On: 10/09/2021 20:54    EKG EKG Interpretation  Date/Time:  Friday October 10 2021 01:27:54 EDT Ventricular Rate:  98 PR Interval:  132 QRS Duration: 79 QT Interval:  351 QTC Calculation: 449 R Axis:   -19 Text Interpretation: Sinus rhythm Confirmed by Davan Nawabi ([id number]) on 10/10/2021 2:05:53 AM  Radiology CT Angio Chest PE W and/or Wo Contrast  Result Date: 10/10/2021 CLINICAL DATA:  Shortness of breath. EXAM: CT ANGIOGRAPHY CHEST WITH CONTRAST TECHNIQUE: Multidetector CT imaging of the chest was performed using the standard protocol during bolus administration of intravenous contrast. Multiplanar CT image reconstructions and MIPs were obtained to evaluate the vascular anatomy. CONTRAST:  61mL OMNIPAQUE IOHEXOL 350 MG/ML SOLN COMPARISON:  Chest radiograph 10/09/2021 FINDINGS: Cardiovascular: Pulmonary arterial opacification is adequate without evidence of emboli. There is no evidence of a thoracic aortic dissection or aneurysm. The heart is normal in size. There is no pericardial effusion. LAD coronary artery atherosclerosis is noted. Mediastinum/Nodes: No enlarged axillary, mediastinal, or hilar lymph nodes. Unremarkable thyroid. Small hiatal hernia. Lungs/Pleura: No pleural effusion or pneumothorax. 3 mm right lower lobe nodule (series 6, image 51). Motion artifact and mild atelectasis predominantly in the lung bases. Upper Abdomen: No acute abnormality. Musculoskeletal: No acute osseous  abnormality or suspicious osseous lesion. Review of the MIP images confirms the above findings. IMPRESSION: 1. No evidence of pulmonary emboli or other acute abnormality in the chest. 2. Small hiatal hernia. 3. 3 mm right lower lobe pulmonary nodule. No follow-up needed if patient is low-risk. Non-contrast chest CT can be considered in 12 months if patient is high-risk. This recommendation follows the consensus statement: Guidelines for Management of Incidental Pulmonary Nodules Detected on CT Images: From the Fleischner Society 2017; Radiology 2017; 284:228-243. Electronically Signed   By: Sebastian Ache M.D.   On: 10/10/2021 05:38   DG Abd Acute W/Chest  Result Date: 10/09/2021 CLINICAL DATA:  Lower abdominal pain EXAM: DG ABDOMEN ACUTE WITH 1 VIEW CHEST COMPARISON:  09/14/2012 FINDINGS: Supine and upright frontal views of the abdomen and pelvis as well as an upright frontal view of the chest are obtained. Cardiac silhouette is unremarkable. Lungs are clear. No effusion or pneumothorax. No bowel obstruction or ileus. Calcified uterine fibroids are seen within the pelvis. Rounded calcifications projecting over the right upper quadrant likely reflect gallstones. No abdominal masses. No free gas in the greater peritoneal sac. No acute bony abnormalities. IMPRESSION: 1. Likely small gallstones projecting over the right upper quadrant. 2. Unremarkable bowel gas pattern. 3. No acute intrathoracic process. Electronically Signed   By: Sharlet Salina M.D.   On: 10/09/2021 20:54    Procedures Procedures   Medications Ordered in ED Medications  OLANZapine zydis (ZYPREXA) disintegrating tablet  5 mg (5 mg Oral Given 10/10/21 0128)  sodium chloride (PF) 0.9 % injection (has no administration in time range)  iohexol (OMNIPAQUE) 350 MG/ML injection 80 mL (80 mLs Intravenous Contrast Given 10/10/21 0457)    ED Course  I have reviewed the triage vital signs and the nursing notes.  Pertinent labs & imaging results  that were available during my care of the patient were reviewed by me and considered in my medical decision making (see chart for details).   Ruled out for MI with negative ekg and troponin.  Heart score is one very low risk for MACE.  Ruled out for PE in the ED.  Patient is unable to state what she is hoping for and she does not meet inpatient psychiatric criteria.  She is instructed to restart her home medication and follow up with her therapist.  She has been informed of hiatal hernia and incidental finding of pulmonary nodule seen on CT scan and need for repeat chest CT within 6 months to assess stability.  This was also printed on her discharge paperwork.  Patient verbalizes understanding and agrees to follow up.    Audi Conover was evaluated in Emergency Department on 10/10/2021 for the symptoms described in the history of present illness. She was evaluated in the context of the global COVID-19 pandemic, which necessitated consideration that the patient might be at risk for infection with the SARS-CoV-2 virus that causes COVID-19. Institutional protocols and algorithms that pertain to the evaluation of patients at risk for COVID-19 are in a state of rapid change based on information released by regulatory bodies including the CDC and federal and state organizations. These policies and algorithms were followed during the patient's care in the ED.  Final Clinical Impression(s) / ED Diagnoses Final diagnoses:  Anxiety   Return for intractable cough, coughing up blood, fevers > 100.4 unrelieved by medication, shortness of breath, intractable vomiting, chest pain, shortness of breath, weakness, numbness, changes in speech, facial asymmetry, abdominal pain, passing out, Inability to tolerate liquids or food, cough, altered mental status or any concerns. No signs of systemic illness or infection. The patient is nontoxic-appearing on exam and vital signs are within normal limits.  I have reviewed the  triage vital signs and the nursing notes. Pertinent labs & imaging results that were available during my care of the patient were reviewed by me and considered in my medical decision making (see chart for details). After history, exam, and medical workup I feel the patient has been appropriately medically screened and is safe for discharge home. Pertinent diagnoses were discussed with the patient. Patient was given return precautions.  Rx / DC Orders ED Discharge Orders     None        Delanee Xin, MD 10/10/21 [phone]

## 2021-10-10 NOTE — ED Notes (Signed)
Patients daughter would like a call back with an update: Hope Budds [phone]

## 2021-11-19 ENCOUNTER — Ambulatory Visit (INDEPENDENT_AMBULATORY_CARE_PROVIDER_SITE_OTHER): Payer: Medicare Other | Admitting: Primary Care

## 2021-12-09 ENCOUNTER — Ambulatory Visit (INDEPENDENT_AMBULATORY_CARE_PROVIDER_SITE_OTHER): Payer: Medicare Other | Admitting: Primary Care

## 2021-12-09 ENCOUNTER — Encounter (INDEPENDENT_AMBULATORY_CARE_PROVIDER_SITE_OTHER): Payer: Self-pay | Admitting: Primary Care

## 2021-12-09 ENCOUNTER — Other Ambulatory Visit: Payer: Self-pay

## 2021-12-09 VITALS — BP 129/83 | HR 74 | Temp 97.9°F | Ht 65.0 in | Wt 186.4 lb

## 2021-12-09 DIAGNOSIS — Z6831 Body mass index (BMI) 31.0-31.9, adult: Secondary | ICD-10-CM

## 2021-12-09 DIAGNOSIS — I10 Essential (primary) hypertension: Secondary | ICD-10-CM | POA: Diagnosis not present

## 2021-12-09 DIAGNOSIS — Z760 Encounter for issue of repeat prescription: Secondary | ICD-10-CM | POA: Diagnosis not present

## 2021-12-09 MED ORDER — METOPROLOL TARTRATE 100 MG PO TABS: 100.0000 mg | ORAL_TABLET | Freq: Two times a day (BID) | ORAL | 1 refills | Status: DC

## 2021-12-09 MED ORDER — AMLODIPINE BESYLATE 10 MG PO TABS: 10.0000 mg | ORAL_TABLET | Freq: Every day | ORAL | 1 refills | Status: DC

## 2021-12-09 NOTE — Progress Notes (Signed)
[facility]   Tracey Ramos is a 65 y.o. female presents for hypertension evaluation, Denies shortness of breath, headaches, chest pain or lower extremity edema, sudden onset, vision changes, unilateral weakness, dizziness, paresthesias   Patient reports adherence with medications.  Dietary habits include: monitor sodium diet  Exercise habits include:walking  Family / Social history: Father- CHF/HTN   Past Medical History:  Diagnosis Date   Cholelithiasis    CKD (chronic kidney disease), stage II    Hernia of abdominal wall    Hypertension    Past Surgical History:  Procedure Laterality Date   APPENDECTOMY     Allergies  Allergen Reactions   Penicillins Swelling   Current Outpatient Medications on File Prior to Visit  Medication Sig Dispense Refill   amLODipine (NORVASC) 10 MG tablet Take 1 tablet (10 mg total) by mouth daily. (Patient not taking: Reported on 10/10/2021) 90 tablet 1   metoprolol tartrate (LOPRESSOR) 100 MG tablet Take 1 tablet (100 mg total) by mouth 2 (two) times daily. (Patient not taking: Reported on 10/10/2021) 180 tablet 1   OLANZapine (ZYPREXA) 15 MG tablet Take 15 mg by mouth at bedtime.     No current facility-administered medications on file prior to visit.   Social History   Socioeconomic History   Marital status: Single    Spouse name: Not on file   Number of children: Not on file   Years of education: Not on file   Highest education level: Not on file  Occupational History   Not on file  Tobacco Use   Smoking status: Former    Types: Cigarettes   Smokeless tobacco: Never  Vaping Use   Vaping Use: Never used  Substance and Sexual Activity   Alcohol use: Not Currently   Drug use: Yes    Types: Marijuana   Sexual activity: Not Currently  Other Topics Concern   Not on file  Social History Narrative   Not on file   Social Determinants of Health   Financial Resource Strain: Not on file  Food Insecurity: Not on  file  Transportation Needs: Not on file  Physical Activity: Not on file  Stress: Not on file  Social Connections: Not on file  Intimate Partner Violence: Not on file   Family History  Problem Relation Age of Onset   Depression Mother    Hypertension Father    Heart attack Father      OBJECTIVE: BP 129/83 (BP Location: Right Arm, Patient Position: Sitting, Cuff Size: Large)    Pulse 74    Temp 97.9 F (36.6 C) (Temporal)    Ht 5\' 5"  (1.651 m)    Wt 186 lb 6.4 oz (84.6 kg)    SpO2 99%    BMI 31.02 kg/m     ROS Comprehensive review of system completed pertinent positive and negative noted in HPI Last 3 Office BP readings: BP Readings from Last 3 Encounters:  10/10/21 (!) 162/128  10/03/21 (!) 160/86  05/15/19 116/72    BMET    Component Value Date/Time   NA 139 10/09/2021 2130   NA 141 03/25/2018 1433   K 3.4 (L) 10/09/2021 2130   CL 102 10/09/2021 2130   CO2 28 10/09/2021 2130   GLUCOSE 95 10/09/2021 2130   BUN 9 10/09/2021 2130   BUN 10 03/25/2018 1433   CREATININE 1.00 10/09/2021 2130   CALCIUM 9.5 10/09/2021 2130   GFRNONAA >60 10/09/2021 2130   GFRAA 61 03/25/2018 1433    Renal  function: CrCl cannot be calculated (Patient's most recent lab result is older than the maximum 21 days allowed.).  Clinical ASCVD: Yes  The ASCVD Risk score (Arnett DK, et al., 2019) failed to calculate for the following reasons:   Cannot find a previous HDL lab   Cannot find a previous total cholesterol lab  ASCVD risk factors include- [place]   ASSESSMENT & PLAN: Tracey Ramos was seen today for hypertension.  Diagnoses and all orders for this visit:  Class 2 severe obesity due to excess calories with serious comorbidity in adult, unspecified BMI ([place])  Healthy lifestyle diet of fruits vegetables fish nuts whole grains and low saturated fat . Foods high in cholesterol or liver, fatty meats,cheese, butter avocados, nuts and seeds, chocolate and fried foods. -     Lipid  Panel  Medication refill metoprolol tartrate (LOPRESSOR) 100 MG tablet; Take 1 tablet (100 mg total) by mouth 2 (two) times daily. amlodipine (NORVASC) 10 MG tablet; Take 1 tablet (10 mg total) by mouth daily.  Hypertension, unspecified type -Counseled on lifestyle modifications for blood pressure control including reduced dietary sodium, increased exercise, weight reduction and adequate sleep. Also, educated patient about the risk for cardiovascular events, stroke and heart attack. Also counseled patient about the importance of medication adherence. If you participate in smoking, it is important to stop using tobacco as this will increase the risks associated with uncontrolled blood pressure.  -Hypertension longstanding diagnosed currently metoprolol tartrate (LOPRESSOR) 100 MG tablet; Take 1 tablet 2 (two) times daily and amlodipine (NORVASC) 10 MG tablet daily.  on current medications. Patient is adherent with current medications.   Goal BP:  For patients younger than 60: Goal BP < 130/80. For patients 60 and older: Goal BP < 140/90. For patients with diabetes: Goal BP < 130/80. Your most recent BP: 129/83  Minimize salt intake. Minimize alcohol intake    This note has been created with Surveyor, quantity. Any transcriptional errors are unintentional.   Kerin Perna, NP 12/09/2021, 11:25 AM

## 2021-12-10 LAB — LIPID PANEL
Chol/HDL Ratio: 4.2 ratio (ref 0.0–4.4)
Cholesterol, Total: 241 mg/dL — ABNORMAL HIGH (ref 100–199)
HDL: 58 mg/dL (ref >39)
LDL Chol Calc (NIH): 155 mg/dL — ABNORMAL HIGH (ref 0–99)
Triglycerides: 158 mg/dL — ABNORMAL HIGH (ref 0–149)
VLDL Cholesterol Cal: 28 mg/dL (ref 5–40)

## 2021-12-11 ENCOUNTER — Other Ambulatory Visit (INDEPENDENT_AMBULATORY_CARE_PROVIDER_SITE_OTHER): Payer: Self-pay | Admitting: Primary Care

## 2021-12-11 MED ORDER — ATORVASTATIN CALCIUM 40 MG PO TABS: 40.0000 mg | ORAL_TABLET | Freq: Every day | ORAL | 3 refills | Status: DC

## 2022-05-30 ENCOUNTER — Other Ambulatory Visit (INDEPENDENT_AMBULATORY_CARE_PROVIDER_SITE_OTHER): Payer: Self-pay | Admitting: Primary Care

## 2022-05-30 DIAGNOSIS — Z760 Encounter for issue of repeat prescription: Secondary | ICD-10-CM

## 2022-05-30 DIAGNOSIS — I10 Essential (primary) hypertension: Secondary | ICD-10-CM

## 2022-06-24 ENCOUNTER — Other Ambulatory Visit (INDEPENDENT_AMBULATORY_CARE_PROVIDER_SITE_OTHER): Payer: Self-pay | Admitting: Primary Care

## 2022-06-24 DIAGNOSIS — Z760 Encounter for issue of repeat prescription: Secondary | ICD-10-CM

## 2022-06-24 DIAGNOSIS — I10 Essential (primary) hypertension: Secondary | ICD-10-CM

## 2022-06-24 NOTE — Telephone Encounter (Signed)
Routed to pcp

## 2022-06-26 NOTE — Telephone Encounter (Signed)
Needs app

## 2022-07-31 IMAGING — CT CT ANGIO CHEST
2 of 6 series · 18 of 36 positions shown · IV contrast (OMNIPAQUE 350)
Comparison: Chest radiograph 10/09/2021

CLINICAL DATA: Shortness of breath.

EXAM:
CT ANGIOGRAPHY CHEST WITH CONTRAST
TECHNIQUE: Multidetector CT imaging of the chest was performed using the
standard protocol during bolus administration of intravenous
contrast. Multiplanar CT image reconstructions and MIPs were
obtained to evaluate the vascular anatomy.
CONTRAST:  80mL OMNIPAQUE IOHEXOL 350 MG/ML SOLN

[Series 5: thins · axial · 0.56mm/px · z∈[+1413,+1616]mm · 17 of 229 slices shown]
[im 13/229  lung]
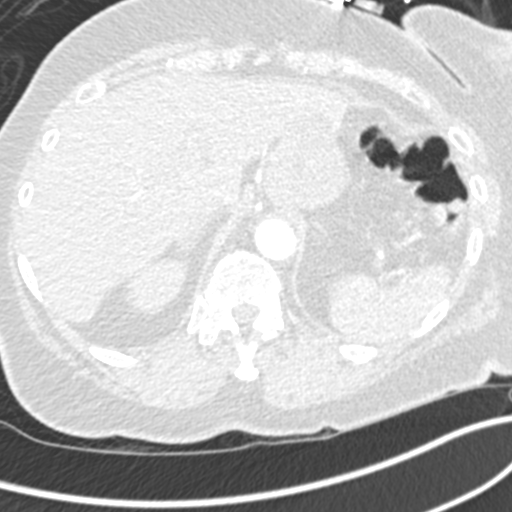
[im 26/229  mediastinal]
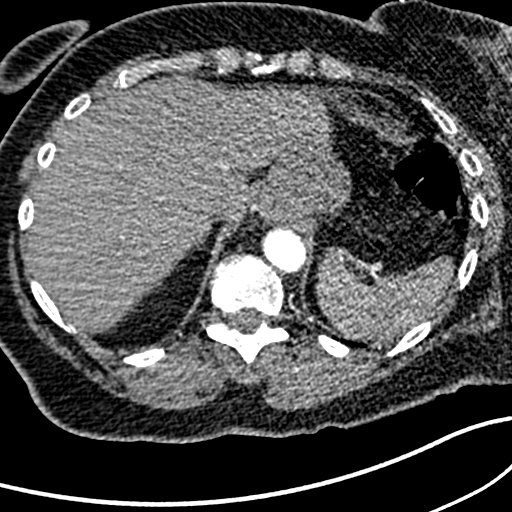
[im 39/229  lung]
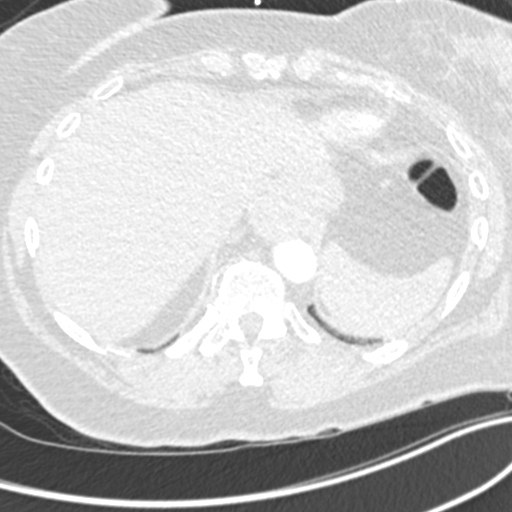
[im 51/229  mediastinal]
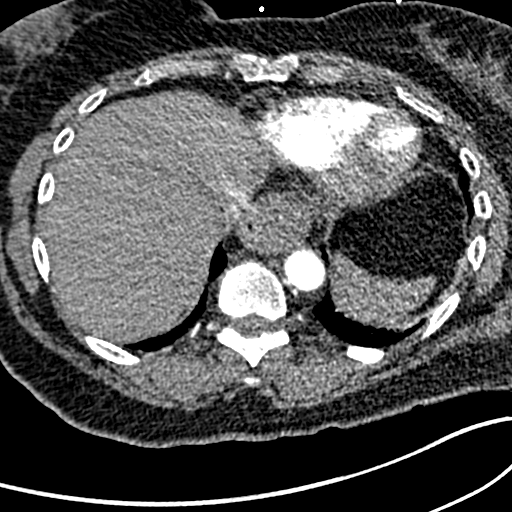
[im 64/229  lung]
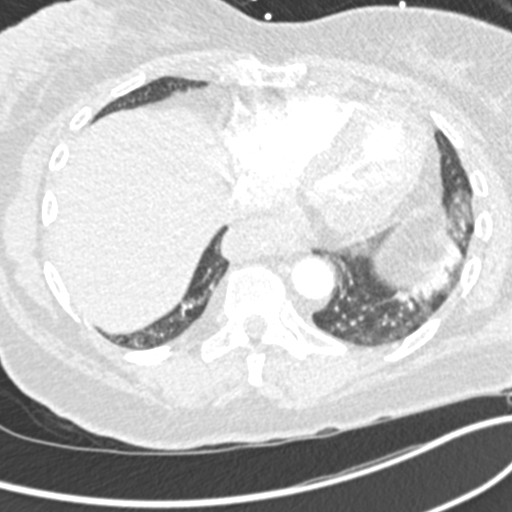
[im 77/229  mediastinal]
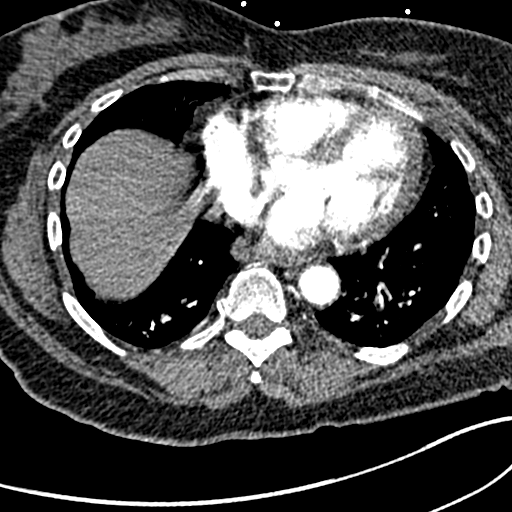
[im 89/229  lung]
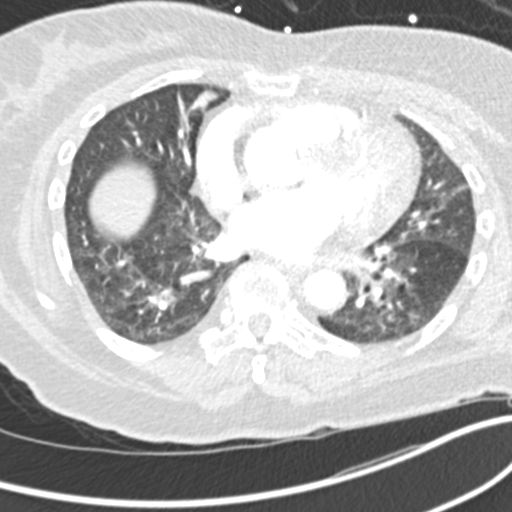
[im 102/229  mediastinal]
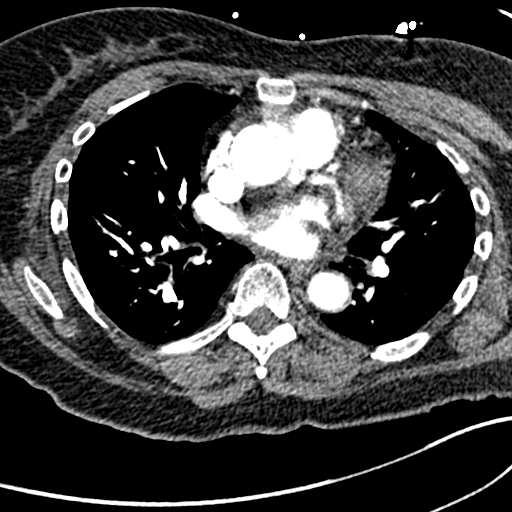
[im 115/229  lung]
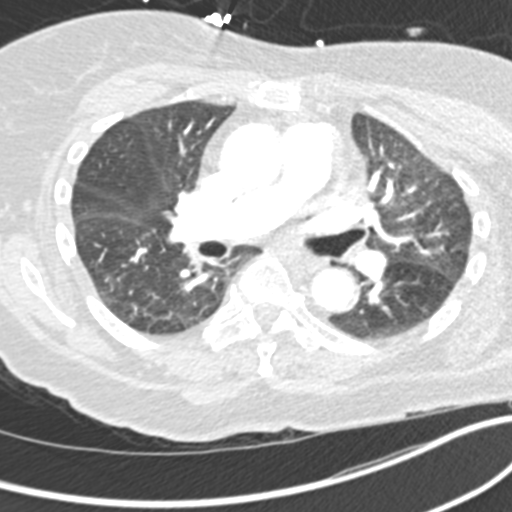
[im 127/229  mediastinal]
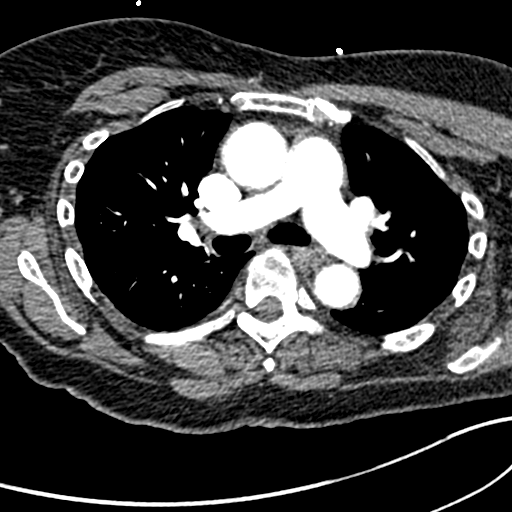
[im 140/229  lung]
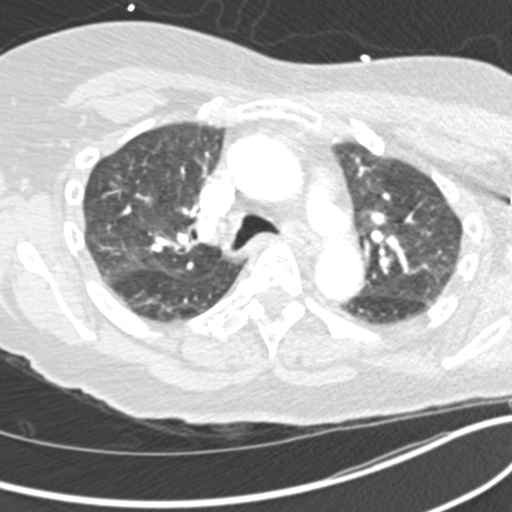
[im 153/229  mediastinal]
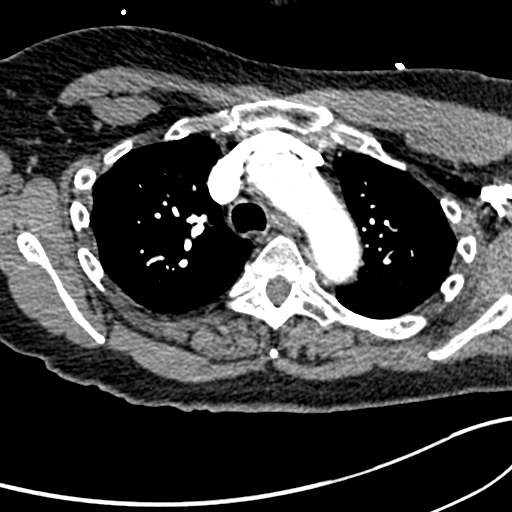
[im 165/229  lung]
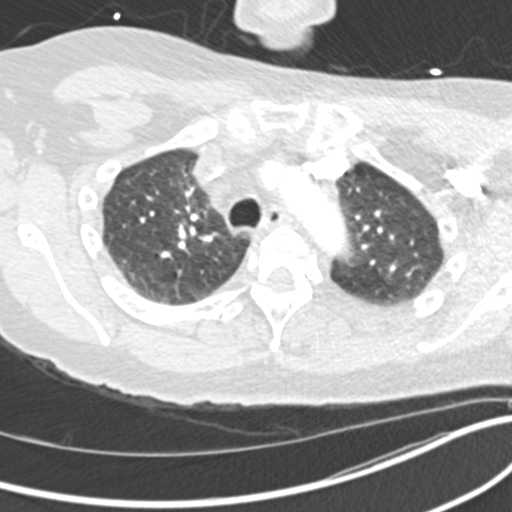
[im 178/229  mediastinal]
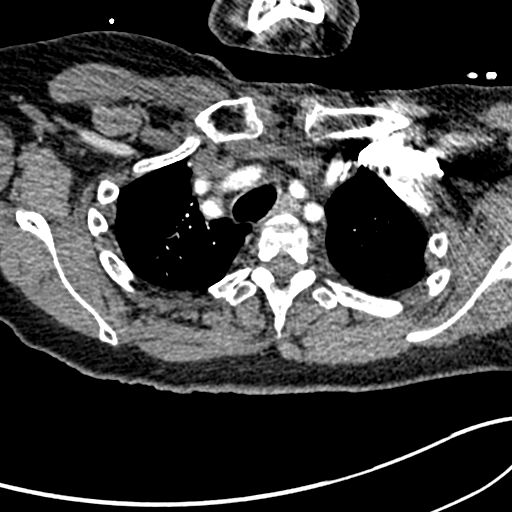
[im 191/229  lung]
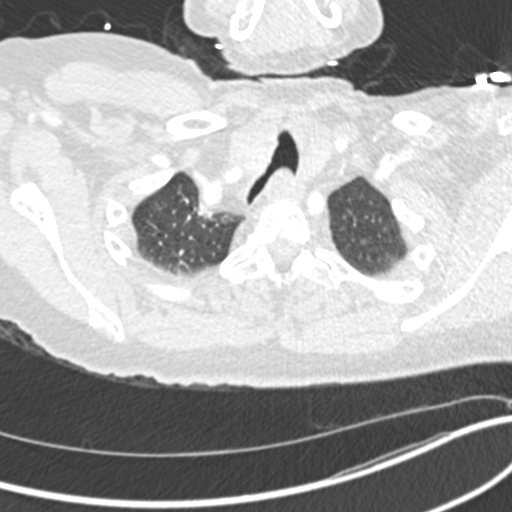
[im 203/229  mediastinal]
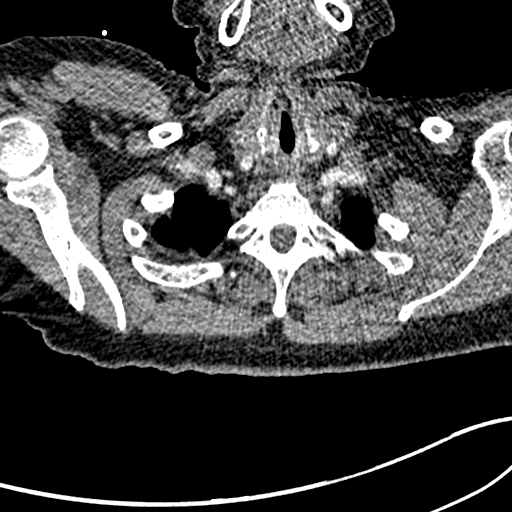
[im 216/229  lung]
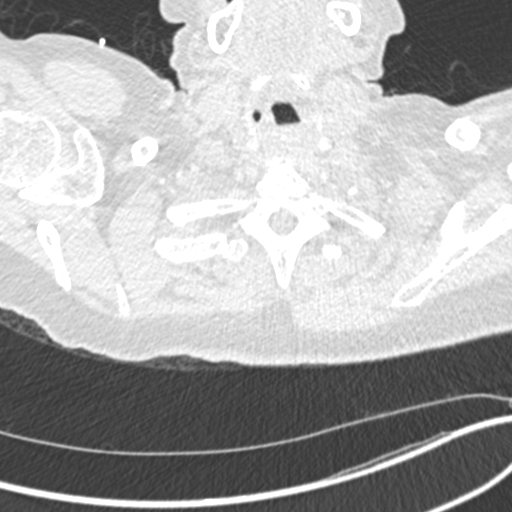

[Series 7: coronal mpr · coronal · 0.47mm/px · 1 of 126 slices shown]
[im 63/126  mediastinal]
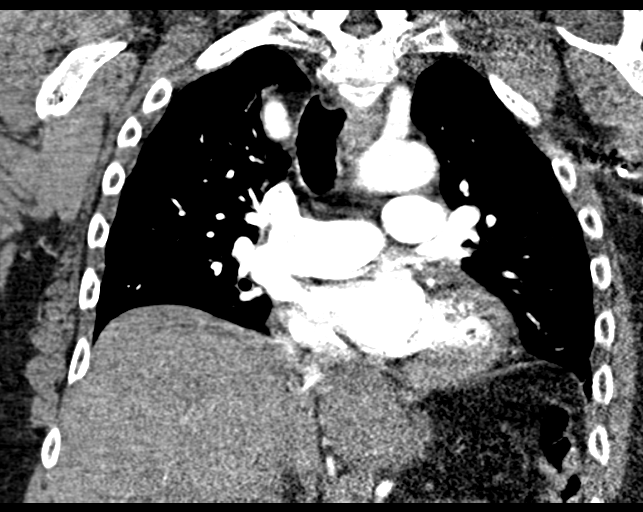

[18 of 36 positions shown; findings below may reference images not displayed]

FINDINGS: Cardiovascular: Pulmonary arterial opacification is adequate without
evidence of emboli. There is no evidence of a thoracic aortic
dissection or aneurysm. The heart is normal in size. There is no
pericardial effusion. LAD coronary artery atherosclerosis is noted.

Mediastinum/Nodes: No enlarged axillary, mediastinal, or hilar lymph
nodes. Unremarkable thyroid. Small hiatal hernia.

Lungs/Pleura: No pleural effusion or pneumothorax. 3 mm right lower
lobe nodule (series 6, image 51). Motion artifact and mild
atelectasis predominantly in the lung bases.

Upper Abdomen: No acute abnormality.

Musculoskeletal: No acute osseous abnormality or suspicious osseous
lesion.

Review of the MIP images confirms the above findings.
IMPRESSION: 1. No evidence of pulmonary emboli or other acute abnormality in the
chest.
2. Small hiatal hernia.
3. 3 mm right lower lobe pulmonary nodule. No follow-up needed if
patient is low-risk. Non-contrast chest CT can be considered in 12
months if patient is high-risk. This recommendation follows the
consensus statement: Guidelines for Management of Incidental
Pulmonary Nodules Detected on CT Images: From the [HOSPITAL]

## 2022-08-06 ENCOUNTER — Encounter (HOSPITAL_COMMUNITY): Payer: Self-pay

## 2022-08-06 ENCOUNTER — Emergency Department (HOSPITAL_COMMUNITY)
Admission: EM | Admit: 2022-08-06 | Discharge: 2022-08-06 | Disposition: A | Discharge status: Home / Self Care | Payer: Medicare Other | Attending: Emergency Medicine | Admitting: Emergency Medicine

## 2022-08-06 ENCOUNTER — Other Ambulatory Visit: Payer: Self-pay

## 2022-08-06 DIAGNOSIS — E860 Dehydration: Secondary | ICD-10-CM | POA: Insufficient documentation

## 2022-08-06 DIAGNOSIS — N289 Disorder of kidney and ureter, unspecified: Secondary | ICD-10-CM | POA: Diagnosis not present

## 2022-08-06 DIAGNOSIS — Z79899 Other long term (current) drug therapy: Secondary | ICD-10-CM | POA: Diagnosis not present

## 2022-08-06 DIAGNOSIS — E876 Hypokalemia: Secondary | ICD-10-CM | POA: Diagnosis not present

## 2022-08-06 DIAGNOSIS — R55 Syncope and collapse: Secondary | ICD-10-CM | POA: Insufficient documentation

## 2022-08-06 LAB — URINALYSIS, ROUTINE W REFLEX MICROSCOPIC
Bilirubin Urine: NEGATIVE
Glucose, UA: NEGATIVE mg/dL
Hgb urine dipstick: NEGATIVE
Ketones, ur: 5 mg/dL — AB
Nitrite: NEGATIVE
Protein, ur: 30 mg/dL — AB
Specific Gravity, Urine: 1.033 — ABNORMAL HIGH (ref 1.005–1.030)
pH: 5 (ref 5.0–8.0)

## 2022-08-06 LAB — BASIC METABOLIC PANEL
Anion gap: 7 (ref 5–15)
BUN: 15 mg/dL (ref 8–23)
CO2: 24 mmol/L (ref 22–32)
Calcium: 9 mg/dL (ref 8.9–10.3)
Chloride: 109 mmol/L (ref 98–111)
Creatinine, Ser: 1.41 mg/dL — ABNORMAL HIGH (ref 0.44–1.00)
GFR, Estimated: 41 mL/min — ABNORMAL LOW (ref >60)
Glucose, Bld: 145 mg/dL — ABNORMAL HIGH (ref 70–99)
Potassium: 3.9 mmol/L (ref 3.5–5.1)
Sodium: 140 mmol/L (ref 135–145)

## 2022-08-06 LAB — CBG MONITORING, ED: Glucose-Capillary: 91 mg/dL (ref 70–99)

## 2022-08-06 LAB — RAPID URINE DRUG SCREEN, HOSP PERFORMED
Amphetamines: NOT DETECTED
Barbiturates: NOT DETECTED
Benzodiazepines: NOT DETECTED
Cocaine: NOT DETECTED
Opiates: NOT DETECTED
Tetrahydrocannabinol: POSITIVE — AB

## 2022-08-06 LAB — CBC
HCT: 42.7 % (ref 36.0–46.0)
Hemoglobin: 14.2 g/dL (ref 12.0–15.0)
MCH: 32.7 pg (ref 26.0–34.0)
MCHC: 33.3 g/dL (ref 30.0–36.0)
MCV: 98.4 fL (ref 80.0–100.0)
Platelets: 247 10*3/uL (ref 150–400)
RBC: 4.34 MIL/uL (ref 3.87–5.11)
RDW: 12 % (ref 11.5–15.5)
WBC: 10.6 10*3/uL — ABNORMAL HIGH (ref 4.0–10.5)
nRBC: 0 % (ref 0.0–0.2)

## 2022-08-06 LAB — ETHANOL: Alcohol, Ethyl (B): <10 mg/dL (ref <10)

## 2022-08-06 MED ORDER — SODIUM CHLORIDE 0.9 % IV BOLUS
1000.0000 mL | Freq: Once | INTRAVENOUS | Status: AC
Start: 2022-08-06 — End: 2022-08-06
  Administered 2022-08-06: 1000 mL via INTRAVENOUS

## 2022-08-06 NOTE — ED Notes (Signed)
I attempted to collect labs and was unsuccessful. 

## 2022-08-06 NOTE — ED Provider Notes (Signed)
[facility]-EMERGENCY DEPT Provider Note   CSN: [id number] Arrival date & time: 08/06/22  1147     History  Chief Complaint  Patient presents with   Loss of Consciousness    Tracey Ramos is a 66 y.o. female.  HPI Patient presents for evaluation of syncope which occurred while she was relaxing smoking marijuana with her granddaughter.  She denies injury from falling.  She states she feels back to herself.  She presents by EMS for evaluation.  Since arriving here she has been able to eat, drink and walk to the bathroom without difficulty.  She does not have any symptoms of chest pain, cough, shortness of breath or dizziness.  She denies urinary frequency, dysuria or hematuria.  She has not had this problem previously.  She is taking her usual medications.    Home Medications Prior to Admission medications   Medication Sig Start Date End Date Taking? Authorizing Provider  amLODipine (NORVASC) 10 MG tablet TAKE 1 TABLET BY MOUTH ONCE DAILY. 06/26/22   Grayce Sessions, NP  atorvastatin (LIPITOR) 40 MG tablet Take 1 tablet (40 mg total) by mouth daily. 12/11/21   Grayce Sessions, NP  metoprolol tartrate (LOPRESSOR) 100 MG tablet TAKE 1 TABLET BY MOUTH TWICE DAILY 06/26/22   Grayce Sessions, NP  OLANZapine (ZYPREXA) 15 MG tablet Take 15 mg by mouth at bedtime.    [provider]      Allergies    Penicillins    Review of Systems   Review of Systems  Physical Exam Updated Vital Signs BP 113/63   Pulse 75   Temp 98.3 F (36.8 C)   Resp 20   Ht 5\' 5"  (1.651 m)   Wt 81.6 kg   SpO2 99%   BMI 29.95 kg/m  Physical Exam  ED Results / Procedures / Treatments   Labs (all labs ordered are listed, but only abnormal results are displayed) Labs Reviewed  BASIC METABOLIC PANEL - Abnormal; Notable for the following components:      Result Value   Glucose, Bld 145 (*)    Creatinine, Ser 1.41 (*)    GFR, Estimated 41 (*)    All other  components within normal limits  CBC - Abnormal; Notable for the following components:   WBC 10.6 (*)    All other components within normal limits  URINALYSIS, ROUTINE W REFLEX MICROSCOPIC - Abnormal; Notable for the following components:   Color, Urine AMBER (*)    APPearance CLOUDY (*)    Specific Gravity, Urine 1.033 (*)    Ketones, ur 5 (*)    Protein, ur 30 (*)    Leukocytes,Ua MODERATE (*)    Bacteria, UA RARE (*)    All other components within normal limits  RAPID URINE DRUG SCREEN, HOSP PERFORMED - Abnormal; Notable for the following components:   Tetrahydrocannabinol POSITIVE (*)    All other components within normal limits  ETHANOL  CBG MONITORING, ED  CBG MONITORING, ED    EKG EKG Interpretation  Date/Time:  Thursday August 06 2022 11:55:17 EDT Ventricular Rate:  94 PR Interval:  143 QRS Duration: 79 QT Interval:  354 QTC Calculation: 443 R Axis:   -12 Text Interpretation: Sinus rhythm Probable left atrial enlargement since last tracing no significant change Confirmed by [id number] [phone]) on 08/06/2022 7:31:32 PM  Radiology No results found.  Procedures Procedures    Medications Ordered in ED Medications  sodium chloride 0.9 % bolus 1,000 mL (1,000 mLs  Intravenous New Bag/Given 08/06/22 1820)    ED Course/ Medical Decision Making/ A&P                           Medical Decision Making Patient presenting with syncope, differential diagnosis includes volume depletion, acute coronary syndrome, metabolic disorders, and CNS disorders.  Amount and/or Complexity of Data Reviewed Independent Historian:     Details: She is a cogent historian Labs: ordered.    Details: CBC, metabolic panel, Urinalysis, alcohol level, urine drug screen-normal except drug screen positive for THC, creatinine elevated, glucose high, GFR low ECG/medicine tests: ordered and independent interpretation performed.    Details: Cardiac monitor -- normal sinus rhythm  Risk Decision  regarding hospitalization. Risk Details: Patient presenting for evaluation of syncope.  She is found to have elevated creatinine from baseline, and increased urine specific gravity.  She does not have urinary tract symptoms.  Patient was treated in the ED with 2 L saline.  She was able to eat and drink and walk without problems.  She is on amlodipine and metoprolol for high blood pressure.  Potassium slightly low, she does not take diuretic medication.  Critical Care Total time providing critical care: 35 minutes           Final Clinical Impression(s) / ED Diagnoses Final diagnoses:  Syncope, unspecified syncope type  Dehydration  Renal insufficiency  Hypokalemia    Rx / DC Orders ED Discharge Orders     None         Mancel Bale, MD 08/06/22 [phone]

## 2022-08-06 NOTE — ED Triage Notes (Signed)
Per EMS- Patient reports that she was smoking marijuana with her granddaughter and had a syncopal episode. Patient's granddaughter states the patient did not hit the floor because she caught her before falling.  EMS reports that the patient's initial BP was 86/40 and after receiving NS 200 ml BP increased to 110/67.

## 2022-08-06 NOTE — ED Provider Triage Note (Signed)
Emergency Medicine Provider Triage Evaluation Note  Tracey Ramos , a 66 y.o. female  was evaluated in triage.  Pt complains of syncope. Pt report she was smoking weed with her granddaughter PTA but had a witnessed syncopal episode.  Denies any injury, no pain, no precipitating sxs.  No hx of syncope.  No cp, sob, heart palpitation, abd pain, back pain, headache. No aclohol use.   Review of Systems  Positive: As above Negative: As above  Physical Exam  BP 116/74 (BP Location: Right Arm)   Pulse 96   Temp 98.3 F (36.8 C) (Oral)   Resp 16   Ht 5\' 5"  (1.651 m)   Wt 81.6 kg   SpO2 96%   BMI 29.95 kg/m  Gen:   Awake, no distress   Resp:  Normal effort  MSK:   Moves extremities without difficulty  Other:    Medical Decision Making  Medically screening exam initiated at 12:16 PM.  Appropriate orders placed.  Tracey Ramos was informed that the remainder of the evaluation will be completed by another provider, this initial triage assessment does not replace that evaluation, and the importance of remaining in the ED until their evaluation is complete.     Tracey Face, PA-C 08/06/22 1217

## 2022-08-06 NOTE — ED Notes (Signed)
Pt A&OX4 ambulatory at d/c with independent steady gait. Pt verbalized understanding of d/c instructions and follow up care. 

## 2022-08-06 NOTE — ED Notes (Signed)
Pt ambulatory to restroom again to provide urine sample.   Dr. Effie Shy informed of patient's orthostatic vital signs.

## 2022-08-06 NOTE — ED Notes (Signed)
Pt ambulatory to and from restroom with independent steady gait. She states she was not able to urinate to provide a urine sample.

## 2022-08-06 NOTE — Discharge Instructions (Signed)
The testing today indicates that you probably passed out because of dehydration.  In addition to your usual diet, try to drink a liter of water every day.  This can improve your condition.  Make sure you follow-up with your primary care doctor for checkup in a week or so.  She will have to check your blood to see if your creatinine and BUN have improved.  Also make sure you are eating foods which contain a lot of potassium to bring up your potassium level.  Return here as needed for problems.

## 2022-08-28 ENCOUNTER — Other Ambulatory Visit (INDEPENDENT_AMBULATORY_CARE_PROVIDER_SITE_OTHER): Payer: Self-pay

## 2022-08-28 DIAGNOSIS — Z760 Encounter for issue of repeat prescription: Secondary | ICD-10-CM

## 2022-08-28 DIAGNOSIS — I10 Essential (primary) hypertension: Secondary | ICD-10-CM

## 2022-08-28 MED ORDER — METOPROLOL TARTRATE 100 MG PO TABS: 100.0000 mg | ORAL_TABLET | Freq: Two times a day (BID) | ORAL | 0 refills | Status: DC

## 2022-08-28 NOTE — Telephone Encounter (Signed)
Requested Prescriptions  Pending Prescriptions Disp Refills  . metoprolol tartrate (LOPRESSOR) 100 MG tablet 60 tablet 0    Sig: Take 1 tablet (100 mg total) by mouth 2 (two) times daily. Please schedule an appt for further refills.     Cardiovascular:  Beta Blockers Failed - 08/28/2022 11:27 AM      Failed - Valid encounter within last 6 months    Recent Outpatient Visits          8 months ago Class 2 severe obesity due to excess calories with serious comorbidity in adult, unspecified BMI (HCC)   [place] [facility] Grayce Sessions, NP   2 years ago Hypertension, unspecified type   [facility] Grayce Sessions, NP   2 years ago Poor dentition   [facility] Grayce Sessions, NP   3 years ago Essential hypertension   [facility] [place], Kinnie Scales, NP   4 years ago Screening for cervical cancer   [facility] Loletta Specter, PA-C             Passed - Last BP in normal range    BP Readings from Last 1 Encounters:  08/06/22 113/63         Passed - Last Heart Rate in normal range    Pulse Readings from Last 1 Encounters:  08/06/22 75

## 2022-08-28 NOTE — Telephone Encounter (Signed)
Pt called, unable to LVM d/t mailbox full. Pt needs to schedule ED fu from 08/06/22 and HTN fu with Marcelino Duster, NP.

## 2022-10-21 ENCOUNTER — Other Ambulatory Visit (INDEPENDENT_AMBULATORY_CARE_PROVIDER_SITE_OTHER): Payer: Self-pay | Admitting: Primary Care

## 2022-10-21 DIAGNOSIS — Z760 Encounter for issue of repeat prescription: Secondary | ICD-10-CM

## 2022-10-21 DIAGNOSIS — I10 Essential (primary) hypertension: Secondary | ICD-10-CM

## 2022-10-22 NOTE — Telephone Encounter (Signed)
Requested medications are due for refill today.  yes  Requested medications are on the active medications list.  yes  Last refill. 08/28/2022 #60 0 rf  Future visit scheduled.   no  Notes to clinic.  Pt is more than 3 months overdue for OV. Courtesy refill already given.    Requested Prescriptions  Pending Prescriptions Disp Refills   metoprolol tartrate (LOPRESSOR) 100 MG tablet [Pharmacy Med Name: metoprolol tartrate 100 mg tablet] 56 tablet 5    Sig: TAKE ONE TABLET BY MOUTH TWICE DAILY     Cardiovascular:  Beta Blockers Failed - 10/21/2022  3:22 PM      Failed - Valid encounter within last 6 months    Recent Outpatient Visits           10 months ago Class 2 severe obesity due to excess calories with serious comorbidity in adult, unspecified BMI ([place])   [place] [facility] Kerin Perna, NP   2 years ago Hypertension, unspecified type   [place], [place], NP   3 years ago Poor dentition   [place], [place], NP   3 years ago Essential hypertension   [place], [place] Cage, NP   4 years ago Screening for cervical cancer   [place], Roger David, PA-C              Passed - Last BP in normal range    BP Readings from Last 1 Encounters:  08/06/22 113/63         Passed - Last Heart Rate in normal range    Pulse Readings from Last 1 Encounters:  08/06/22 75

## 2022-11-15 ENCOUNTER — Other Ambulatory Visit (INDEPENDENT_AMBULATORY_CARE_PROVIDER_SITE_OTHER): Payer: Self-pay | Admitting: Primary Care

## 2023-02-01 ENCOUNTER — Ambulatory Visit (INDEPENDENT_AMBULATORY_CARE_PROVIDER_SITE_OTHER): Payer: Medicare PPO | Admitting: Primary Care

## 2023-02-11 ENCOUNTER — Ambulatory Visit (INDEPENDENT_AMBULATORY_CARE_PROVIDER_SITE_OTHER): Payer: 59 | Admitting: Primary Care

## 2023-02-11 ENCOUNTER — Encounter (INDEPENDENT_AMBULATORY_CARE_PROVIDER_SITE_OTHER): Payer: Self-pay | Admitting: Primary Care

## 2023-02-11 VITALS — BP 125/78 | HR 88 | Resp 16 | Ht 65.0 in | Wt 195.4 lb

## 2023-02-11 DIAGNOSIS — E782 Mixed hyperlipidemia: Secondary | ICD-10-CM | POA: Diagnosis not present

## 2023-02-11 DIAGNOSIS — Z1211 Encounter for screening for malignant neoplasm of colon: Secondary | ICD-10-CM

## 2023-02-11 DIAGNOSIS — Z1231 Encounter for screening mammogram for malignant neoplasm of breast: Secondary | ICD-10-CM

## 2023-02-11 DIAGNOSIS — E2839 Other primary ovarian failure: Secondary | ICD-10-CM

## 2023-02-11 DIAGNOSIS — Z760 Encounter for issue of repeat prescription: Secondary | ICD-10-CM

## 2023-02-11 DIAGNOSIS — I10 Essential (primary) hypertension: Secondary | ICD-10-CM

## 2023-02-11 MED ORDER — ZOSTER VAC RECOMB ADJUVANTED 50 MCG/0.5ML IM SUSR: 0.5000 mL | Freq: Once | INTRAMUSCULAR | 1 refills | Status: AC

## 2023-02-11 MED ORDER — AMLODIPINE BESYLATE 10 MG PO TABS: 10.0000 mg | ORAL_TABLET | Freq: Every day | ORAL | 1 refills | Status: DC

## 2023-02-12 ENCOUNTER — Other Ambulatory Visit (INDEPENDENT_AMBULATORY_CARE_PROVIDER_SITE_OTHER): Payer: Self-pay | Admitting: Primary Care

## 2023-02-12 LAB — CMP14+EGFR
ALT: 27 IU/L (ref 0–32)
AST: 19 IU/L (ref 0–40)
Albumin/Globulin Ratio: 1.5 (ref 1.2–2.2)
Albumin: 4.6 g/dL (ref 3.9–4.9)
Alkaline Phosphatase: 105 IU/L (ref 44–121)
BUN/Creatinine Ratio: 12 (ref 12–28)
BUN: 12 mg/dL (ref 8–27)
Bilirubin Total: 0.4 mg/dL (ref 0.0–1.2)
CO2: 24 mmol/L (ref 20–29)
Calcium: 9.7 mg/dL (ref 8.7–10.3)
Chloride: 101 mmol/L (ref 96–106)
Creatinine, Ser: 0.99 mg/dL (ref 0.57–1.00)
Globulin, Total: 3 g/dL (ref 1.5–4.5)
Glucose: 81 mg/dL (ref 70–99)
Potassium: 4.2 mmol/L (ref 3.5–5.2)
Sodium: 141 mmol/L (ref 134–144)
Total Protein: 7.6 g/dL (ref 6.0–8.5)
eGFR: 63 mL/min/{1.73_m2} (ref >59)

## 2023-02-12 LAB — LIPID PANEL
Chol/HDL Ratio: 3.8 ratio (ref 0.0–4.4)
Cholesterol, Total: 241 mg/dL — ABNORMAL HIGH (ref 100–199)
HDL: 64 mg/dL (ref >39)
LDL Chol Calc (NIH): 149 mg/dL — ABNORMAL HIGH (ref 0–99)
Triglycerides: 156 mg/dL — ABNORMAL HIGH (ref 0–149)
VLDL Cholesterol Cal: 28 mg/dL (ref 5–40)

## 2023-02-12 MED ORDER — ROSUVASTATIN CALCIUM 40 MG PO TABS: 40.0000 mg | ORAL_TABLET | Freq: Every day | ORAL | 1 refills | Status: DC

## 2023-02-15 ENCOUNTER — Ambulatory Visit (INDEPENDENT_AMBULATORY_CARE_PROVIDER_SITE_OTHER): Payer: 59

## 2023-02-16 ENCOUNTER — Encounter (INDEPENDENT_AMBULATORY_CARE_PROVIDER_SITE_OTHER): Payer: 59

## 2023-02-16 ENCOUNTER — Telehealth (INDEPENDENT_AMBULATORY_CARE_PROVIDER_SITE_OTHER): Payer: Self-pay

## 2023-02-16 ENCOUNTER — Ambulatory Visit (INDEPENDENT_AMBULATORY_CARE_PROVIDER_SITE_OTHER): Payer: 59

## 2023-02-16 NOTE — Telephone Encounter (Signed)
Contacted pt to start medicare visit pt didn't answer and was unable to lvm due to vm being full

## 2023-02-17 ENCOUNTER — Telehealth: Payer: Self-pay | Admitting: *Deleted

## 2023-02-17 NOTE — Telephone Encounter (Signed)
Unable to reach. Attempt to call to schedule an appt for mammogram on 3/1 or 3/11.  Voicemail full.

## 2023-02-17 NOTE — Telephone Encounter (Signed)
Unable to reach, voicemail full.  Attempt to schedule mammogram apt.  3/1 or 3/11

## 2023-02-21 NOTE — Progress Notes (Signed)
Tracey Ramos, is a 67 y.o. female  [id number]  [id number]  DOB - [birth year]  Chief Complaint  Patient presents with   Hypertension       Subjective:   Tracey Ramos is a 67 y.o. female here today for a follow up visit for HTN. Patient has No headache, No chest pain, No abdominal pain - No Nausea, No new weakness tingling or numbness, No Cough - shortness of breath  No problems updated.  Allergies  Allergen Reactions   Penicillins Swelling    Past Medical History:  Diagnosis Date   Cholelithiasis    CKD (chronic kidney disease), stage II    Hernia of abdominal wall    Hypertension     Current Outpatient Medications on File Prior to Visit  Medication Sig Dispense Refill   OLANZapine (ZYPREXA) 15 MG tablet Take 15 mg by mouth at bedtime.     No current facility-administered medications on file prior to visit.    Objective:   Vitals:   02/11/23 1108  BP: 125/78  Pulse: 88  Resp: 16  SpO2: 98%  Weight: 195 lb 6.4 oz (88.6 kg)  Height: '5\' 5"'$  (1.651 m)    Comprehensive ROS Pertinent positive and negative noted in HPI   Exam General appearance : Awake, alert, not in any distress. Speech Clear. Not toxic looking HEENT: Atraumatic and Normocephalic, pupils equally reactive to light and accomodation Neck: Supple, no JVD. No cervical lymphadenopathy.  Chest: Good air entry bilaterally, no added sounds  CVS: S1 S2 regular, no murmurs.  Abdomen: Bowel sounds present, Non tender and not distended with no gaurding, rigidity or rebound. Extremities: B/L Lower Ext shows no edema, both legs are warm to touch Neurology: Awake alert, and oriented X 3, Non focal Skin: No Rash  Data Review Lab Results  Component Value Date   HGBA1C 4.8 03/25/2018    Assessment & Plan   Giovanna was seen today for hypertension.  Diagnoses and all orders for this visit:  Colon cancer screening -     Cologuard  Medication refill -      amLODipine (NORVASC) 10 MG tablet; Take 1 tablet (10 mg total) by mouth daily.  Hypertension, unspecified type -     amLODipine (NORVASC) 10 MG tablet; Take 1 tablet (10 mg total) by mouth daily. -     CMP14+EGFR  Class 2 severe obesity due to excess calories with serious comorbidity in adult, unspecified BMI ([place]) Obesity is 30-39 indicating an excess in caloric intake or underlining conditions. This may lead to other co-morbidities. Educated on lifestyle modifications of diet and exercise which may reduce obesity.    Mixed hyperlipidemia -     Lipid Panel  Encounter for screening mammogram for malignant neoplasm of breast -     MM DIGITAL SCREENING BILATERAL; Future  Estrogen deficiency -     DG Bone Density; Future  Other orders -     Zoster Vaccine Adjuvanted [facility]) injection; Inject 0.5 mLs into the muscle once for 1 dose.     Patient have been counseled extensively about nutrition and exercise. Other issues discussed during this visit include: low cholesterol diet, weight control and daily exercise, foot care, annual eye examinations at Ophthalmology, importance of adherence with medications and regular follow-up. We also discussed long term complications of uncontrolled diabetes and hypertension.   Return for [place].  The patient was given clear instructions to go to ER or return to medical center if symptoms don't improve,  worsen or new problems develop. The patient verbalized understanding. The patient was told to call to get lab results if they haven't heard anything in the next week.   This note has been created with Surveyor, quantity. Any transcriptional errors are unintentional.   Kerin Perna, NP 02/21/2023, 7:59 PM

## 2023-03-06 LAB — COLOGUARD: COLOGUARD: NEGATIVE

## 2023-03-09 ENCOUNTER — Ambulatory Visit
Admission: RE | Admit: 2023-03-09 | Discharge: 2023-03-09 | Disposition: A | Discharge status: Home / Self Care | Payer: 59 | Source: Ambulatory Visit | Attending: Primary Care | Admitting: Primary Care

## 2023-03-09 DIAGNOSIS — Z1231 Encounter for screening mammogram for malignant neoplasm of breast: Secondary | ICD-10-CM

## 2023-03-12 ENCOUNTER — Other Ambulatory Visit: Payer: Self-pay | Admitting: Primary Care

## 2023-03-12 DIAGNOSIS — R928 Other abnormal and inconclusive findings on diagnostic imaging of breast: Secondary | ICD-10-CM

## 2023-04-12 ENCOUNTER — Ambulatory Visit
Admission: RE | Admit: 2023-04-12 | Discharge: 2023-04-12 | Disposition: A | Discharge status: Home / Self Care | Payer: 59 | Source: Ambulatory Visit | Attending: Primary Care | Admitting: Primary Care

## 2023-04-12 ENCOUNTER — Telehealth (INDEPENDENT_AMBULATORY_CARE_PROVIDER_SITE_OTHER): Payer: Self-pay

## 2023-04-12 DIAGNOSIS — R928 Other abnormal and inconclusive findings on diagnostic imaging of breast: Secondary | ICD-10-CM

## 2023-04-12 NOTE — Telephone Encounter (Signed)
Pt has a prescription for amLODipine (NORVASC) 10 MG tablet [[id number]] that was sent to [facility] in [place], [place]. Pt says she has no transportation to pick up the medication and wants to know can Marcelino Duster send the prescription somewhere that will deliver it to her. Please follow up with pt.

## 2023-04-12 NOTE — Telephone Encounter (Signed)
Contacted pt to see where she wants her rx sent to pt states she doesn't know and she will call back to give that information

## 2023-04-19 ENCOUNTER — Other Ambulatory Visit (HOSPITAL_COMMUNITY)
Admission: RE | Admit: 2023-04-19 | Discharge: 2023-04-19 | Disposition: A | Discharge status: Home / Self Care | Payer: 59 | Source: Ambulatory Visit | Attending: Primary Care | Admitting: Primary Care

## 2023-04-19 ENCOUNTER — Encounter (INDEPENDENT_AMBULATORY_CARE_PROVIDER_SITE_OTHER): Payer: Self-pay | Admitting: Primary Care

## 2023-04-19 ENCOUNTER — Ambulatory Visit (INDEPENDENT_AMBULATORY_CARE_PROVIDER_SITE_OTHER): Payer: 59 | Admitting: Primary Care

## 2023-04-19 VITALS — BP 115/78 | HR 80 | Resp 16 | Wt 195.0 lb

## 2023-04-19 DIAGNOSIS — Z124 Encounter for screening for malignant neoplasm of cervix: Secondary | ICD-10-CM

## 2023-04-19 DIAGNOSIS — Z01411 Encounter for gynecological examination (general) (routine) with abnormal findings: Secondary | ICD-10-CM | POA: Diagnosis not present

## 2023-04-19 DIAGNOSIS — R87611 Atypical squamous cells cannot exclude high grade squamous intraepithelial lesion on cytologic smear of cervix (ASC-H): Secondary | ICD-10-CM | POA: Diagnosis not present

## 2023-04-19 DIAGNOSIS — Z1151 Encounter for screening for human papillomavirus (HPV): Secondary | ICD-10-CM | POA: Diagnosis not present

## 2023-04-19 DIAGNOSIS — R87810 Cervical high risk human papillomavirus (HPV) DNA test positive: Secondary | ICD-10-CM | POA: Insufficient documentation

## 2023-04-19 NOTE — Progress Notes (Signed)
  [facility]  WELL-WOMAN PHYSICAL & PAP Patient name: Tracey Ramos MRN [id number]  Date of birth: 1956-12-18 Chief Complaint:   No chief complaint on file.  History of Present Illness:   Tracey Ramos is a 67 y.o. No obstetric history on file. female being seen today for a routine well-woman exam.   The current method of family planning is abstinence.  No LMP recorded. Patient is postmenopausal. Last pap unknown Last mammogram: 03/09/23. Results were: normal. Family h/o breast cancer: No Cologuard : 02/24/23. Results were: normal. Family h/o colorectal cancer: No  Review of Systems:    Denies any headaches, blurred vision, fatigue, shortness of breath, chest pain, abdominal pain, abnormal vaginal discharge/itching/odor/irritation, problems with periods, bowel movements, urination, or intercourse unless otherwise stated above.  Pertinent History Reviewed:   Reviewed past medical,surgical, social and family history.  Reviewed problem list, medications and allergies.  Physical Assessment:  There were no vitals filed for this visit.There is no height or weight on file to calculate BMI.        Physical Examination:  General appearance - well appearing, and in no distress Mental status - alert, oriented to person, place, and time Psych:  She has a normal mood and affect Skin - warm and dry, normal color, no suspicious lesions noted Chest - effort normal, all lung fields clear to auscultation bilaterally Heart - normal rate and regular rhythm Neck:  midline trachea, no thyromegaly or nodules Breasts - breasts appear normal, no suspicious masses, no skin or nipple changes or axillary nodes Educated patient on proper self breast examination and had patient to demonstrate SBE. Abdomen - soft, nontender, nondistended, no masses or organomegaly Pelvic-VULVA: normal appearing vulva with no masses, tenderness or lesions   VAGINA: normal appearing vagina with normal color  and discharge, no lesions   CERVIX: normal appearing cervix without discharge or lesions, no CMT UTERUS: uterus is felt to be normal size, shape, consistency and nontender  ADNEXA: No adnexal masses or tenderness noted. Extremities:  No swelling or varicosities noted  No results found for this or any previous visit (from the past 24 hour(s)).   Assessment & Plan:  Tracey Ramos was seen today for annual exam.  Diagnoses and all orders for this visit:  Cervical cancer screening -     Cervicovaginal ancillary only -     Cytology - PAP This note has been created with Dragon speech recognition software and Paediatric nurse. Any transcriptional errors are unintentional.   Tracey Sessions, NP 04/19/2023, 11:48 AM

## 2023-04-21 LAB — CERVICOVAGINAL ANCILLARY ONLY
Bacterial Vaginitis (gardnerella): NEGATIVE
Candida Glabrata: NEGATIVE
Candida Vaginitis: NEGATIVE
Chlamydia: NEGATIVE
Comment: NEGATIVE
Comment: NEGATIVE
Comment: NEGATIVE
Comment: NEGATIVE
Comment: NEGATIVE
Comment: NORMAL
Neisseria Gonorrhea: NEGATIVE
Trichomonas: NEGATIVE

## 2023-04-23 LAB — CYTOLOGY - PAP
Comment: NEGATIVE
Comment: NEGATIVE
Comment: NEGATIVE
Diagnosis: HIGH — AB
HPV 16: NEGATIVE
HPV 18 / 45: NEGATIVE
High risk HPV: POSITIVE — AB

## 2023-04-26 ENCOUNTER — Other Ambulatory Visit (INDEPENDENT_AMBULATORY_CARE_PROVIDER_SITE_OTHER): Payer: Self-pay | Admitting: Primary Care

## 2023-04-26 DIAGNOSIS — R87629 Unspecified abnormal cytological findings in specimens from vagina: Secondary | ICD-10-CM

## 2023-05-12 ENCOUNTER — Telehealth: Payer: Self-pay

## 2023-05-12 NOTE — Telephone Encounter (Signed)
Called patient to schedule new patient appointment. Left voicemail with our contact information to call back and schedule.  

## 2023-06-15 ENCOUNTER — Telehealth: Payer: Self-pay

## 2023-06-15 NOTE — Telephone Encounter (Signed)
Attempted to call patient to schedule an appointment but was unable to leave a voicemail due to voicemail being full.  

## 2023-07-16 ENCOUNTER — Other Ambulatory Visit (INDEPENDENT_AMBULATORY_CARE_PROVIDER_SITE_OTHER): Payer: Self-pay | Admitting: Primary Care

## 2023-07-16 NOTE — Telephone Encounter (Signed)
Requested medications are due for refill today.  yes  Requested medications are on the active medications list.  yes  Last refill. 02/12/2023 #90 1 rf  Future visit scheduled.   no  Notes to clinic.  No pcp listed.    Requested Prescriptions  Pending Prescriptions Disp Refills   rosuvastatin (CRESTOR) 40 MG tablet [Pharmacy Med Name: ROSUVASTATIN CALCIUM 40MG  TAB] 90 tablet 4    Sig: TAKE ONE TABLET BY MOUTH DAILY     Cardiovascular:  Antilipid - Statins 2 Failed - 07/16/2023  9:36 AM      Failed - Lipid Panel in normal range within the last 12 months    Cholesterol, Total  Date Value Ref Range Status  02/11/2023 241 (H) 100 - 199 mg/dL Final   LDL Chol Calc (NIH)  Date Value Ref Range Status  02/11/2023 149 (H) 0 - 99 mg/dL Final   HDL  Date Value Ref Range Status  02/11/2023 64 >39 mg/dL Final   Triglycerides  Date Value Ref Range Status  02/11/2023 156 (H) 0 - 149 mg/dL Final         Passed - Cr in normal range and within 360 days    Creatinine, Ser  Date Value Ref Range Status  02/11/2023 0.99 0.57 - 1.00 mg/dL Final   Creatinine, Urine  Date Value Ref Range Status  03/07/2018 295.87 mg/dL Final    Comment:    Performed at [facility], [address] [place], [place] [address]         Passed - Patient is not pregnant      Passed - Valid encounter within last 12 months    Recent Outpatient Visits           2 months ago Cervical cancer screening   [place] [facility] Grayce Sessions, NP   5 months ago Colon cancer screening   [place] [facility] Grayce Sessions, NP   1 year ago Class 2 severe obesity due to excess calories with serious comorbidity in adult, unspecified BMI (HCC)   [place] [facility] Grayce Sessions, NP   3 years ago Hypertension, unspecified type   [place] [facility] Grayce Sessions, NP   3 years ago Poor dentition   [place]  [facility] Grayce Sessions, NP

## 2023-07-20 ENCOUNTER — Other Ambulatory Visit (INDEPENDENT_AMBULATORY_CARE_PROVIDER_SITE_OTHER): Payer: Self-pay

## 2023-07-20 MED ORDER — ROSUVASTATIN CALCIUM 40 MG PO TABS: 40.0000 mg | ORAL_TABLET | Freq: Every day | ORAL | 0 refills | Status: DC

## 2023-07-21 ENCOUNTER — Inpatient Hospital Stay: Admission: RE | Admit: 2023-07-21 | Payer: 59 | Source: Ambulatory Visit

## 2023-10-09 ENCOUNTER — Other Ambulatory Visit (INDEPENDENT_AMBULATORY_CARE_PROVIDER_SITE_OTHER): Payer: Self-pay | Admitting: Primary Care

## 2023-10-09 DIAGNOSIS — I10 Essential (primary) hypertension: Secondary | ICD-10-CM

## 2023-10-09 DIAGNOSIS — Z760 Encounter for issue of repeat prescription: Secondary | ICD-10-CM

## 2023-10-11 NOTE — Telephone Encounter (Signed)
Requested Prescriptions  Pending Prescriptions Disp Refills   rosuvastatin (CRESTOR) 40 MG tablet [Pharmacy Med Name: ROSUVASTATIN CALCIUM 40MG  TAB] 90 tablet 0    Sig: TAKE ONE TABLET BY MOUTH DAILY     Cardiovascular:  Antilipid - Statins 2 Failed - 10/09/2023 11:43 AM      Failed - Lipid Panel in normal range within the last 12 months    Cholesterol, Total  Date Value Ref Range Status  02/11/2023 241 (H) 100 - 199 mg/dL Final   LDL Chol Calc (NIH)  Date Value Ref Range Status  02/11/2023 149 (H) 0 - 99 mg/dL Final   HDL  Date Value Ref Range Status  02/11/2023 64 >39 mg/dL Final   Triglycerides  Date Value Ref Range Status  02/11/2023 156 (H) 0 - 149 mg/dL Final         Passed - Cr in normal range and within 360 days    Creatinine, Ser  Date Value Ref Range Status  02/11/2023 0.99 0.57 - 1.00 mg/dL Final   Creatinine, Urine  Date Value Ref Range Status  03/07/2018 295.87 mg/dL Final    Comment:    Performed at [facility], [address], [place], [place] [address]         Passed - Patient is not pregnant      Passed - Valid encounter within last 12 months    Recent Outpatient Visits           5 months ago Cervical cancer screening   [place] [facility] Grayce Sessions, NP   8 months ago Colon cancer screening   [place] [facility] Grayce Sessions, NP   1 year ago Class 2 severe obesity due to excess calories with serious comorbidity in adult, unspecified BMI (HCC)   [place] [facility] Grayce Sessions, NP   3 years ago Hypertension, unspecified type   [place] [facility] Grayce Sessions, NP   4 years ago Poor dentition   [place] [facility] Grayce Sessions, NP               amLODipine (NORVASC) 10 MG tablet [Pharmacy Med Name: amLODIPine BESYLATE 10MG  TAB] 90 tablet 0    Sig: TAKE ONE TABLET BY MOUTH DAILY      Cardiovascular: Calcium Channel Blockers 2 Passed - 10/09/2023 11:43 AM      Passed - Last BP in normal range    BP Readings from Last 1 Encounters:  04/19/23 115/78         Passed - Last Heart Rate in normal range    Pulse Readings from Last 1 Encounters:  04/19/23 80         Passed - Valid encounter within last 6 months    Recent Outpatient Visits           5 months ago Cervical cancer screening   [place] [facility] Grayce Sessions, NP   8 months ago Colon cancer screening   [place] [facility] Grayce Sessions, NP   1 year ago Class 2 severe obesity due to excess calories with serious comorbidity in adult, unspecified BMI (HCC)   [place] [facility] Grayce Sessions, NP   3 years ago Hypertension, unspecified type   [place] [facility] Grayce Sessions, NP   4 years ago Poor dentition    [facility] [place],  Kinnie Scales, NP

## 2024-01-14 ENCOUNTER — Other Ambulatory Visit (INDEPENDENT_AMBULATORY_CARE_PROVIDER_SITE_OTHER): Payer: Self-pay | Admitting: Primary Care

## 2024-01-14 DIAGNOSIS — Z760 Encounter for issue of repeat prescription: Secondary | ICD-10-CM

## 2024-01-14 DIAGNOSIS — I10 Essential (primary) hypertension: Secondary | ICD-10-CM

## 2024-01-14 NOTE — Telephone Encounter (Signed)
Courtesy refill  Requested Prescriptions  Pending Prescriptions Disp Refills   amLODipine (NORVASC) 10 MG tablet [Pharmacy Med Name: amLODIPine BESYLATE 10MG  TAB] 30 tablet 0    Sig: Take 1 tablet (10 mg total) by mouth daily. Please schedule appt for further refills.     Cardiovascular: Calcium Channel Blockers 2 Failed - 01/14/2024  2:43 PM      Failed - Valid encounter within last 6 months    Recent Outpatient Visits           9 months ago Cervical cancer screening   [place] [facility] Grayce Sessions, NP   11 months ago Colon cancer screening   [place] [facility] Grayce Sessions, NP   2 years ago Class 2 severe obesity due to excess calories with serious comorbidity in adult, unspecified BMI (HCC)   [place] [facility] Grayce Sessions, NP   4 years ago Hypertension, unspecified type   [place] [facility] Grayce Sessions, NP   4 years ago Poor dentition   [place] [facility] Grayce Sessions, NP              Passed - Last BP in normal range    BP Readings from Last 1 Encounters:  04/19/23 115/78         Passed - Last Heart Rate in normal range    Pulse Readings from Last 1 Encounters:  04/19/23 80          rosuvastatin (CRESTOR) 40 MG tablet [Pharmacy Med Name: ROSUVASTATIN CALCIUM 40MG  TAB] 30 tablet 0    Sig: Take 1 tablet (40 mg total) by mouth daily. Please schedule appt for further refills.     Cardiovascular:  Antilipid - Statins 2 Failed - 01/14/2024  2:43 PM      Failed - Lipid Panel in normal range within the last 12 months    Cholesterol, Total  Date Value Ref Range Status  02/11/2023 241 (H) 100 - 199 mg/dL Final   LDL Chol Calc (NIH)  Date Value Ref Range Status  02/11/2023 149 (H) 0 - 99 mg/dL Final   HDL  Date Value Ref Range Status  02/11/2023 64 >39 mg/dL Final   Triglycerides  Date Value Ref Range Status  02/11/2023 156  (H) 0 - 149 mg/dL Final         Passed - Cr in normal range and within 360 days    Creatinine, Ser  Date Value Ref Range Status  02/11/2023 0.99 0.57 - 1.00 mg/dL Final   Creatinine, Urine  Date Value Ref Range Status  03/07/2018 295.87 mg/dL Final    Comment:    Performed at [facility], [address] [place], [place] [address]         Passed - Patient is not pregnant      Passed - Valid encounter within last 12 months    Recent Outpatient Visits           9 months ago Cervical cancer screening   [place] [facility] Grayce Sessions, NP   11 months ago Colon cancer screening   [place] [facility] Grayce Sessions, NP   2 years ago Class 2 severe obesity due to excess calories with serious comorbidity in adult, unspecified BMI (HCC)   [place] [facility] Grayce Sessions, NP   4 years ago Hypertension, unspecified type   [facility] Sessions, NP   4 years ago Poor dentition   [place] [facility] Grayce Sessions, NP

## 2024-02-03 ENCOUNTER — Other Ambulatory Visit (INDEPENDENT_AMBULATORY_CARE_PROVIDER_SITE_OTHER): Payer: Self-pay | Admitting: Primary Care

## 2024-02-03 DIAGNOSIS — I10 Essential (primary) hypertension: Secondary | ICD-10-CM

## 2024-02-03 DIAGNOSIS — Z760 Encounter for issue of repeat prescription: Secondary | ICD-10-CM

## 2024-02-18 ENCOUNTER — Other Ambulatory Visit (INDEPENDENT_AMBULATORY_CARE_PROVIDER_SITE_OTHER): Payer: Self-pay | Admitting: Primary Care

## 2024-02-18 DIAGNOSIS — Z760 Encounter for issue of repeat prescription: Secondary | ICD-10-CM

## 2024-02-18 DIAGNOSIS — I10 Essential (primary) hypertension: Secondary | ICD-10-CM

## 2024-02-21 ENCOUNTER — Other Ambulatory Visit (INDEPENDENT_AMBULATORY_CARE_PROVIDER_SITE_OTHER): Payer: Self-pay | Admitting: Primary Care

## 2024-02-21 DIAGNOSIS — I10 Essential (primary) hypertension: Secondary | ICD-10-CM

## 2024-02-21 DIAGNOSIS — Z760 Encounter for issue of repeat prescription: Secondary | ICD-10-CM

## 2024-02-21 NOTE — Telephone Encounter (Signed)
 Requested medication (s) are due for refill today: yes  Requested medication (s) are on the active medication list: yes  Last refill:  01/14/24  Future visit scheduled: no  Notes to clinic:  Unable to refill per protocol, courtesy refill already given, routing for provider approval.      Requested Prescriptions  Pending Prescriptions Disp Refills   amLODipine (NORVASC) 10 MG tablet [Pharmacy Med Name: amLODIPine BESYLATE 10MG  TAB] 30 tablet 0    Sig: Take 1 tablet (10 mg total) by mouth daily. Please schedule appt for further refills.     Cardiovascular: Calcium Channel Blockers 2 Failed - 02/21/2024 11:25 AM      Failed - Valid encounter within last 6 months    Recent Outpatient Visits           10 months ago Cervical cancer screening   [place] [facility] Grayce Sessions, NP   1 year ago Colon cancer screening   [place] [facility] Grayce Sessions, NP   2 years ago Class 2 severe obesity due to excess calories with serious comorbidity in adult, unspecified BMI (HCC)   [place] [facility] Grayce Sessions, NP   4 years ago Hypertension, unspecified type   [place] [facility] Grayce Sessions, NP   4 years ago Poor dentition   [place] [facility] Grayce Sessions, NP              Passed - Last BP in normal range    BP Readings from Last 1 Encounters:  04/19/23 115/78         Passed - Last Heart Rate in normal range    Pulse Readings from Last 1 Encounters:  04/19/23 80          rosuvastatin (CRESTOR) 40 MG tablet [Pharmacy Med Name: ROSUVASTATIN CALCIUM 40MG  TAB] 30 tablet 0    Sig: Take 1 tablet (40 mg total) by mouth daily. Please schedule appt for further refills.     Cardiovascular:  Antilipid - Statins 2 Failed - 02/21/2024 11:25 AM      Failed - Cr in normal range and within 360 days    Creatinine, Ser  Date Value Ref Range Status   02/11/2023 0.99 0.57 - 1.00 mg/dL Final   Creatinine, Urine  Date Value Ref Range Status  03/07/2018 295.87 mg/dL Final    Comment:    Performed at [facility], [address], [place], [place] [address]         Failed - Lipid Panel in normal range within the last 12 months    Cholesterol, Total  Date Value Ref Range Status  02/11/2023 241 (H) 100 - 199 mg/dL Final   LDL Chol Calc (NIH)  Date Value Ref Range Status  02/11/2023 149 (H) 0 - 99 mg/dL Final   HDL  Date Value Ref Range Status  02/11/2023 64 >39 mg/dL Final   Triglycerides  Date Value Ref Range Status  02/11/2023 156 (H) 0 - 149 mg/dL Final         Passed - Patient is not pregnant      Passed - Valid encounter within last 12 months    Recent Outpatient Visits           10 months ago Cervical cancer screening   [place] [facility] Grayce Sessions, NP   1 year ago Colon cancer screening   [place] [facility] Sessions, NP   2 years ago Class 2 severe obesity due to excess calories with serious comorbidity in adult, unspecified BMI (HCC)   [place] [facility] Grayce Sessions, NP   4 years ago Hypertension, unspecified type   [place] [facility] Grayce Sessions, NP   4 years ago Poor dentition   [place] [facility] Grayce Sessions, NP

## 2024-02-23 ENCOUNTER — Other Ambulatory Visit (INDEPENDENT_AMBULATORY_CARE_PROVIDER_SITE_OTHER): Payer: Self-pay | Admitting: Primary Care

## 2024-02-23 DIAGNOSIS — I10 Essential (primary) hypertension: Secondary | ICD-10-CM

## 2024-02-23 DIAGNOSIS — Z760 Encounter for issue of repeat prescription: Secondary | ICD-10-CM

## 2024-02-24 NOTE — Telephone Encounter (Signed)
 Last refill courtesy refill- needs appointment Requested Prescriptions  Pending Prescriptions Disp Refills   rosuvastatin (CRESTOR) 40 MG tablet [Pharmacy Med Name: ROSUVASTATIN CALCIUM 40MG  TAB] 90 tablet 1    Sig: TAKE ONE TABLET BY MOUTH DAILY     Cardiovascular:  Antilipid - Statins 2 Failed - 02/24/2024 11:33 AM      Failed - Cr in normal range and within 360 days    Creatinine, Ser  Date Value Ref Range Status  02/11/2023 0.99 0.57 - 1.00 mg/dL Final   Creatinine, Urine  Date Value Ref Range Status  03/07/2018 295.87 mg/dL Final    Comment:    Performed at [facility], [address], [place], [place] [address]         Failed - Lipid Panel in normal range within the last 12 months    Cholesterol, Total  Date Value Ref Range Status  02/11/2023 241 (H) 100 - 199 mg/dL Final   LDL Chol Calc (NIH)  Date Value Ref Range Status  02/11/2023 149 (H) 0 - 99 mg/dL Final   HDL  Date Value Ref Range Status  02/11/2023 64 >39 mg/dL Final   Triglycerides  Date Value Ref Range Status  02/11/2023 156 (H) 0 - 149 mg/dL Final         Passed - Patient is not pregnant      Passed - Valid encounter within last 12 months    Recent Outpatient Visits           10 months ago Cervical cancer screening   [place] [facility] Sessions, NP   1 year ago Colon cancer screening   [place] [facility] Grayce Sessions, NP   2 years ago Class 2 severe obesity due to excess calories with serious comorbidity in adult, unspecified BMI (HCC)   [place] [facility] Grayce Sessions, NP   4 years ago Hypertension, unspecified type   [place] [facility] Grayce Sessions, NP   4 years ago Poor dentition   [place] [facility] Grayce Sessions, NP               amLODipine (NORVASC) 10 MG tablet [Pharmacy Med Name: amLODIPine BESYLATE 10MG  TAB] 90 tablet 1    Sig: TAKE  ONE TABLET BY MOUTH DAILY     Cardiovascular: Calcium Channel Blockers 2 Failed - 02/24/2024 11:33 AM      Failed - Valid encounter within last 6 months    Recent Outpatient Visits           10 months ago Cervical cancer screening   [place] [facility] Grayce Sessions, NP   1 year ago Colon cancer screening   [place] [facility] Grayce Sessions, NP   2 years ago Class 2 severe obesity due to excess calories with serious comorbidity in adult, unspecified BMI (HCC)   [place] [facility] Grayce Sessions, NP   4 years ago Hypertension, unspecified type   [place] [facility] Grayce Sessions, NP   4 years ago Poor dentition   [place] [facility] Gray[facility] Sessions, NP              Passed - Last BP in normal range    BP Readings from Last 1 Encounters:  04/19/23 115/78         Passed - Last Heart Rate in  normal range    Pulse Readings from Last 1 Encounters:  04/19/23 80

## 2024-02-25 ENCOUNTER — Other Ambulatory Visit: Payer: Self-pay | Admitting: Primary Care

## 2024-02-25 ENCOUNTER — Telehealth: Payer: Self-pay

## 2024-02-25 DIAGNOSIS — Z1231 Encounter for screening mammogram for malignant neoplasm of breast: Secondary | ICD-10-CM

## 2024-02-25 NOTE — Telephone Encounter (Signed)
 Copied from CRM [id number]. Topic: Clinical - Medication Question >> Feb 25, 2024  1:53 PM Herbert Seta B wrote: Reason for CRM: Patient calling because she needs her blood pressure medication sent to her pharmacy for refill, doesn't know what name of medications are. [phone]

## 2024-02-25 NOTE — Telephone Encounter (Signed)
 Call to patient to advise that no refill can be approved until she come in for office visit.  Unable to reach patient unable to leave message. VM full.

## 2024-03-02 ENCOUNTER — Other Ambulatory Visit (INDEPENDENT_AMBULATORY_CARE_PROVIDER_SITE_OTHER): Payer: Self-pay | Admitting: Primary Care

## 2024-03-02 DIAGNOSIS — Z760 Encounter for issue of repeat prescription: Secondary | ICD-10-CM

## 2024-03-02 DIAGNOSIS — I10 Essential (primary) hypertension: Secondary | ICD-10-CM

## 2024-03-03 NOTE — Telephone Encounter (Signed)
 Requested medication (s) are due for refill today: yes to both  Requested medication (s) are on the active medication list: yes to both  Last refill:  both reordered 01/14/24 #30  Future visit scheduled: NO  Notes to clinic:  call pt twice, both times call was answered and could hear a child talking then call was disconnected on their end both times.    Requested Prescriptions  Pending Prescriptions Disp Refills   rosuvastatin (CRESTOR) 40 MG tablet [Pharmacy Med Name: ROSUVASTATIN CALCIUM 40MG  TAB] 30 tablet 0    Sig: Take 1 tablet (40 mg total) by mouth daily. Please schedule appt for further refills.     Cardiovascular:  Antilipid - Statins 2 Failed - 03/03/2024 10:26 AM      Failed - Cr in normal range and within 360 days    Creatinine, Ser  Date Value Ref Range Status  02/11/2023 0.99 0.57 - 1.00 mg/dL Final   Creatinine, Urine  Date Value Ref Range Status  03/07/2018 295.87 mg/dL Final    Comment:    Performed at [facility], [address], [place], [place] [address]         Failed - Lipid Panel in normal range within the last 12 months    Cholesterol, Total  Date Value Ref Range Status  02/11/2023 241 (H) 100 - 199 mg/dL Final   LDL Chol Calc (NIH)  Date Value Ref Range Status  02/11/2023 149 (H) 0 - 99 mg/dL Final   HDL  Date Value Ref Range Status  02/11/2023 64 >39 mg/dL Final   Triglycerides  Date Value Ref Range Status  02/11/2023 156 (H) 0 - 149 mg/dL Final         Passed - Patient is not pregnant      Passed - Valid encounter within last 12 months    Recent Outpatient Visits           10 months ago Cervical cancer screening   [place] [facility] Grayce Sessions, NP   1 year ago Colon cancer screening   [place] [facility] Grayce Sessions, NP   2 years ago Class 2 severe obesity due to excess calories with serious comorbidity in adult, unspecified BMI (HCC)   [place] [facility] Gray[facility] Sessions, NP   4 years ago Hypertension, unspecified type   [place] [facility] Grayce Sessions, NP   4 years ago Poor dentition   [place] [facility] Grayce Sessions, NP               amLODipine (NORVASC) 10 MG tablet [Pharmacy Med Name: amLODIPine BESYLATE 10MG  TAB] 30 tablet 0    Sig: Take 1 tablet (10 mg total) by mouth daily. Please schedule appt for further refills.     Cardiovascular: Calcium Channel Blockers 2 Failed - 03/03/2024 10:26 AM      Failed - Valid encounter within last 6 months    Recent Outpatient Visits           10 months ago Cervical cancer screening   [place] [facility] Grayce Sessions, NP   1 year ago Colon cancer screening   [place] [facility] Grayce Sessions, NP   2 years ago Class 2 severe obesity due to excess calories with serious comorbidity in adult, unspecified BMI (HCC)    [facility]   4  years ago Hypertension, unspecified type   [place] [facility] Grayce Sessions, NP   4 years ago Poor dentition   [place] [facility] Grayce Sessions, NP              Passed - Last BP in normal range    BP Readings from Last 1 Encounters:  04/19/23 115/78         Passed - Last Heart Rate in normal range    Pulse Readings from Last 1 Encounters:  04/19/23 80

## 2024-03-08 ENCOUNTER — Other Ambulatory Visit (INDEPENDENT_AMBULATORY_CARE_PROVIDER_SITE_OTHER): Payer: Self-pay | Admitting: Primary Care

## 2024-03-08 DIAGNOSIS — I10 Essential (primary) hypertension: Secondary | ICD-10-CM

## 2024-03-08 DIAGNOSIS — Z760 Encounter for issue of repeat prescription: Secondary | ICD-10-CM

## 2024-03-09 ENCOUNTER — Other Ambulatory Visit (INDEPENDENT_AMBULATORY_CARE_PROVIDER_SITE_OTHER): Payer: Self-pay | Admitting: Primary Care

## 2024-03-09 DIAGNOSIS — I10 Essential (primary) hypertension: Secondary | ICD-10-CM

## 2024-03-09 DIAGNOSIS — Z760 Encounter for issue of repeat prescription: Secondary | ICD-10-CM

## 2024-03-21 ENCOUNTER — Telehealth: Payer: Self-pay | Admitting: *Deleted

## 2024-03-21 NOTE — Telephone Encounter (Signed)
 Unable to leave message mail box full.  Please inform patient her visit on 03-22-2024 is going to be a telephone visit for an AWV  not in person.

## 2024-03-22 ENCOUNTER — Ambulatory Visit (INDEPENDENT_AMBULATORY_CARE_PROVIDER_SITE_OTHER)

## 2024-03-23 ENCOUNTER — Other Ambulatory Visit (INDEPENDENT_AMBULATORY_CARE_PROVIDER_SITE_OTHER): Payer: Self-pay | Admitting: Primary Care

## 2024-03-23 DIAGNOSIS — I10 Essential (primary) hypertension: Secondary | ICD-10-CM

## 2024-03-23 DIAGNOSIS — Z760 Encounter for issue of repeat prescription: Secondary | ICD-10-CM

## 2024-03-23 NOTE — Telephone Encounter (Signed)
 Copied from CRM [id number]. Topic: Clinical - Medication Refill >> Mar 23, 2024 11:20 AM Fuller Canada P wrote: Most Recent Primary Care Visit:  Provider: Grayce Sessions  Department[facility]  Visit Type: OFFICE VISIT  Date: 04/19/2023  Medication: metoprolol tartrate (LOPRESSOR) tablet 100 mg  Has the patient contacted their pharmacy? No (Agent: If no, request that the patient contact the pharmacy for the refill. If patient does not wish to contact the pharmacy document the reason why and proceed with request.) (Agent: If yes, when and what did the pharmacy advise?)  Is this the correct pharmacy for this prescription? Yes If no, delete pharmacy and type the correct one.  This is the patient's preferred pharmacy:  [facility] - [place], [place] - [address] [facility] [address] [place] [address] Phone: [phone] Fax: [phone]   Has the prescription been filled recently? No  Is the patient out of the medication? Yes  Has the patient been seen for an appointment in the last year OR does the patient have an upcoming appointment? Yes  Can we respond through MyChart? No  Agent: Please be advised that Rx refills may take up to 3 business days. We ask that you follow-up with your pharmacy.

## 2024-03-23 NOTE — Telephone Encounter (Signed)
 Copied from CRM [id number]. Topic: Clinical - Medication Refill >> Mar 23, 2024 11:16 AM Ivette P wrote: Most Recent Primary Care Visit:  Provider: Grayce Sessions  Department: [facility]  Visit Type: OFFICE VISIT  Date: 04/19/2023  Medication: amLODipine (NORVASC) 10 MG tablet, rosuvastatin (CRESTOR) 40 MG tablet  Has the patient contacted their pharmacy? Yes (Agent: If no, request that the patient contact the pharmacy for the refill. If patient does not wish to contact the pharmacy document the reason why and proceed with request.) (Agent: If yes, when and what did the pharmacy advise?)  Is this the correct pharmacy for this prescription? Yes If no, delete pharmacy and type the correct one.  This is the patient's preferred pharmacy:  [facility] - [place], [place] - [address] [place] [address] Phone: [phone] Fax: [phone]   Has the prescription been filled recently? No  Is the patient out of the medication? Yes, pt has 4 pills left  Has the patient been seen for an appointment in the last year OR does the patient have an upcoming appointment? Yes  Can we respond through MyChart? No  Agent: Please be advised that Rx refills may take up to 3 business days. We ask that you follow-up with your pharmacy.

## 2024-03-24 NOTE — Telephone Encounter (Signed)
 Requested medication (s) are due for refill today - no  Requested medication (s) are on the active medication list -no  Future visit scheduled -no  Last refill: unsure  Notes to clinic: medication not listed on patient medication list  Requested Prescriptions  Pending Prescriptions Disp Refills   metoprolol tartrate (LOPRESSOR) 100 MG tablet 180 tablet 3    Sig: Take 1 tablet (100 mg total) by mouth 2 (two) times daily.     Cardiovascular:  Beta Blockers Failed - 03/24/2024  2:19 PM      Failed - Valid encounter within last 6 months    Recent Outpatient Visits           11 months ago Cervical cancer screening   [place] [facility] Sessions, NP   1 year ago Colon cancer screening   [place] [facility] Sessions, NP   2 years ago Class 2 severe obesity due to excess calories with serious comorbidity in adult, unspecified BMI (HCC)   [place] [facility] Grayce Sessions, NP   4 years ago Hypertension, unspecified type   [place] [facility] Grayce Sessions, NP   4 years ago Poor dentition   [place] [facility] Grayce Sessions, NP              Passed - Last BP in normal range    BP Readings from Last 1 Encounters:  04/19/23 115/78         Passed - Last Heart Rate in normal range    Pulse Readings from Last 1 Encounters:  04/19/23 80         Refused Prescriptions Disp Refills   amLODipine (NORVASC) 10 MG tablet 30 tablet 0    Sig: Take 1 tablet (10 mg total) by mouth daily. Please schedule appt for further refills.     Cardiovascular: Calcium Channel Blockers 2 Failed - 03/24/2024  2:19 PM      Failed - Valid encounter within last 6 months    Recent Outpatient Visits           11 months ago Cervical cancer screening   [place] [facility] Sessions, NP   1 year ago Colon cancer screening   [place]  [facility] Sessions, NP   2 years ago Class 2 severe obesity due to excess calories with serious comorbidity in adult, unspecified BMI (HCC)   [place] [facility] Grayce Sessions, NP   4 years ago Hypertension, unspecified type   [place] [facility] Sessions, NP   4 years ago Poor dentition    [facility], NP              Passed - Last BP in normal range    BP Readings from Last 1 Encounters:  04/19/23 115/78         Passed - Last Heart Rate in normal range    Pulse Readings from Last 1 Encounters:  04/19/23 80          rosuvastatin (CRESTOR) 40 MG tablet 30 tablet 0    Sig: Take 1 tablet (40 mg total) by mouth daily. Please schedule appt for further refills.     Cardiovascular:  Antilipid - Statins 2 Failed - 03/24/2024  2:19 PM      Failed - Cr in normal range and within  360 days    Creatinine, Ser  Date Value Ref Range Status  02/11/2023 0.99 0.57 - 1.00 mg/dL Final   Creatinine, Urine  Date Value Ref Range Status  03/07/2018 295.87 mg/dL Final    Comment:    Performed at [facility], [address] [place], [place] [address]         Failed - Lipid Panel in normal range within the last 12 months    Cholesterol, Total  Date Value Ref Range Status  02/11/2023 241 (H) 100 - 199 mg/dL Final   LDL Chol Calc (NIH)  Date Value Ref Range Status  02/11/2023 149 (H) 0 - 99 mg/dL Final   HDL  Date Value Ref Range Status  02/11/2023 64 >39 mg/dL Final   Triglycerides  Date Value Ref Range Status  02/11/2023 156 (H) 0 - 149 mg/dL Final         Passed - Patient is not pregnant      Passed - Valid encounter within last 12 months    Recent Outpatient Visits           11 months ago Cervical cancer screening   [place] [facility] Sessions, NP   1 year ago Colon cancer screening   [place]  [facility] Sessions, NP   2 years ago Class 2 severe obesity due to excess calories with serious comorbidity in adult, unspecified BMI (HCC)   [place] [facility] Sessions, NP   4 years ago Hypertension, unspecified type   [place] [facility] Grayce Sessions, NP   4 years ago Poor dentition   [place] [facility] Grayce Sessions, NP                 Requested Prescriptions  Pending Prescriptions Disp Refills   metoprolol tartrate (LOPRESSOR) 100 MG tablet 180 tablet 3    Sig: Take 1 tablet (100 mg total) by mouth 2 (two) times daily.     Cardiovascular:  Beta Blockers Failed - 03/24/2024  2:19 PM      Failed - Valid encounter within last 6 months    Recent Outpatient Visits           11 months ago Cervical cancer screening   [place] [facility]ce Sessions, NP   1 year ago Colon cancer screening   [place] [facility]ce Sessions, NP   2 years ago Class 2 severe obesity due to excess calories with serious comorbidity in adult, unspecified BMI (HCC)   [place] [facility]ce Sessions, NP   4 years ago Hypertension, unspecified type   [place] [facility] Grayce Sessions, NP   4 years ago Poor dentition   [place] [facility] Grayce Sessions, NP              Passed - Last BP in normal range    BP Readings from Last 1 Encounters:  04/19/23 115/78         Passed - Last Heart Rate in normal range    Pulse Readings from Last 1 Encounters:  04/19/23 80         Refused Prescriptions Disp Refills   amLODipine (NORVASC) 10 MG tablet 30 tablet 0    Sig: Take 1 tablet (10 mg total) by mouth daily. Please schedule appt for further refills.  Cardiovascular: Calcium Channel Blockers 2 Failed - 03/24/2024  2:19 PM      Failed - Valid  encounter within last 6 months    Recent Outpatient Visits           11 months ago Cervical cancer screening   [place] [facility] Sessions, NP   1 year ago Colon cancer screening   [place] [facility] Sessions, NP   2 years ago Class 2 severe obesity due to excess calories with serious comorbidity in adult, unspecified BMI (HCC)   [place] [facility] Grayce Sessions, NP   4 years ago Hypertension, unspecified type   [place] [facility]ce Sessions, NP   4 years ago Poor dentition   [place] [facility] Grayce Sessions, NP              Passed - Last BP in normal range    BP Readings from Last 1 Encounters:  04/19/23 115/78         Passed - Last Heart Rate in normal range    Pulse Readings from Last 1 Encounters:  04/19/23 80          rosuvastatin (CRESTOR) 40 MG tablet 30 tablet 0    Sig: Take 1 tablet (40 mg total) by mouth daily. Please schedule appt for further refills.     Cardiovascular:  Antilipid - Statins 2 Failed - 03/24/2024  2:19 PM      Failed - Cr in normal range and within 360 days    Creatinine, Ser  Date Value Ref Range Status  02/11/2023 0.99 0.57 - 1.00 mg/dL Final   Creatinine, Urine  Date Value Ref Range Status  03/07/2018 295.87 mg/dL Final    Comment:    Performed at [facility], [address] [place], [place] [address]         Failed - Lipid Panel in normal range within the last 12 months    Cholesterol, Total  Date Value Ref Range Status  02/11/2023 241 (H) 100 - 199 mg/dL Final   LDL Chol Calc (NIH)  Date Value Ref Range Status  02/11/2023 149 (H) 0 - 99 mg/dL Final   HDL  Date Value Ref Range Status  02/11/2023 64 >39 mg/dL Final   Triglycerides  Date Value Ref Range Status  02/11/2023 156 (H) 0 - 149 mg/dL Final         Passed - Patient is not pregnant      Passed - Valid  encounter within last 12 months    Recent Outpatient Visits           11 months ago Cervical cancer screening   [place] [facility] Sessions, NP   1 year ago Colon cancer screening   [place] [facility] Grayce Sessions, NP   2 years ago Class 2 severe obesity due to excess calories with serious comorbidity in adult, unspecified BMI (HCC)   [place] [facility]ce Sessions, NP   4 years ago Hypertension, unspecified type   [place] [facility] Grayce Sessions, NP   4 years ago Poor dentition   [place] [facility] Grayce Sessions, NP

## 2024-03-24 NOTE — Telephone Encounter (Signed)
 Notes to clinic: Duplicate request- medication not listed on current medication list   Requested Prescriptions  Pending Prescriptions Disp Refills   metoprolol tartrate (LOPRESSOR) 100 MG tablet 180 tablet 3    Sig: Take 1 tablet (100 mg total) by mouth 2 (two) times daily.     Cardiovascular:  Beta Blockers Failed - 03/24/2024  2:21 PM      Failed - Valid encounter within last 6 months    Recent Outpatient Visits           11 months ago Cervical cancer screening   [place] [facility] Grayce Sessions, NP   1 year ago Colon cancer screening   [place] [facility]ce Sessions, NP   2 years ago Class 2 severe obesity due to excess calories with serious comorbidity in adult, unspecified BMI (HCC)   [place] [facility] Grayce Sessions, NP   4 years ago Hypertension, unspecified type   [place] [facility] Grayce Sessions, NP   4 years ago Poor dentition   [place] [facility] Grayce Sessions, NP              Passed - Last BP in normal range    BP Readings from Last 1 Encounters:  04/19/23 115/78         Passed - Last Heart Rate in normal range    Pulse Readings from Last 1 Encounters:  04/19/23 80         Refused Prescriptions Disp Refills   amLODipine (NORVASC) 10 MG tablet 30 tablet 0    Sig: Take 1 tablet (10 mg total) by mouth daily. Please schedule appt for further refills.     Cardiovascular: Calcium Channel Blockers 2 Failed - 03/24/2024  2:21 PM      Failed - Valid encounter within last 6 months    Recent Outpatient Visits           11 months ago Cervical cancer screening   [place] [facility]ce Sessions, NP   1 year ago Colon cancer screening   [place] [facility] Grayce Sessions, NP   2 years ago Class 2 severe obesity due to excess calories with serious comorbidity in adult,  unspecified BMI (HCC)   [place] [facility] Grayce Sessions, NP   4 years ago Hypertension, unspecified type   [place] [facility] Grayce Sessions, NP   4 years ago Poor dentition   [place] [facility] Grayce Sessions, NP              Passed - Last BP in normal range    BP Readings from Last 1 Encounters:  04/19/23 115/78         Passed - Last Heart Rate in normal range    Pulse Readings from Last 1 Encounters:  04/19/23 80          rosuvastatin (CRESTOR) 40 MG tablet 30 tablet 0    Sig: Take 1 tablet (40 mg total) by mouth daily. Please schedule appt for further refills.     Cardiovascular:  Antilipid - Statins 2 Failed - 03/24/2024  2:21 PM      Failed - Cr in normal range and within 360 days    Creatinine, Ser  Date Value Ref Range Status  02/11/2023 0.99 0.57 - 1.00 mg/dL Final   Creatinine, Urine  Date  Value Ref Range Status  03/07/2018 295.87 mg/dL Final    Comment:    Performed at [facility], [address] [place], [place] [address]         Failed - Lipid Panel in normal range within the last 12 months    Cholesterol, Total  Date Value Ref Range Status  02/11/2023 241 (H) 100 - 199 mg/dL Final   LDL Chol Calc (NIH)  Date Value Ref Range Status  02/11/2023 149 (H) 0 - 99 mg/dL Final   HDL  Date Value Ref Range Status  02/11/2023 64 >39 mg/dL Final   Triglycerides  Date Value Ref Range Status  02/11/2023 156 (H) 0 - 149 mg/dL Final         Passed - Patient is not pregnant      Passed - Valid encounter within last 12 months    Recent Outpatient Visits           11 months ago Cervical cancer screening   [place] [facility] Grayce Sessions, NP   1 year ago Colon cancer screening   [place] [facility] Sessions, NP   2 years ago Class 2 severe obesity due to excess calories with serious comorbidity in adult,  unspecified BMI (HCC)   [place] [facility] Grayce Sessions, NP   4 years ago Hypertension, unspecified type   [place] [facility] Grayce Sessions, NP   4 years ago Poor dentition   [place] [facility] Grayce Sessions, NP                 Requested Prescriptions  Pending Prescriptions Disp Refills   metoprolol tartrate (LOPRESSOR) 100 MG tablet 180 tablet 3    Sig: Take 1 tablet (100 mg total) by mouth 2 (two) times daily.     Cardiovascular:  Beta Blockers Failed - 03/24/2024  2:21 PM      Failed - Valid encounter within last 6 months    Recent Outpatient Visits           11 months ago Cervical cancer screening   [place] [facility] Grayce Sessions, NP   1 year ago Colon cancer screening   [place] [facility] Grayce Sessions, NP   2 years ago Class 2 severe obesity due to excess calories with serious comorbidity in adult, unspecified BMI (HCC)   [place] [facility] Grayce Sessions, NP   4 years ago Hypertension, unspecified type   [place] [facility] Grayce Sessions, NP   4 years ago Poor dentition   [place] [facility] Grayce Sessions, NP              Passed - Last BP in normal range    BP Readings from Last 1 Encounters:  04/19/23 115/78         Passed - Last Heart Rate in normal range    Pulse Readings from Last 1 Encounters:  04/19/23 80         Refused Prescriptions Disp Refills   amLODipine (NORVASC) 10 MG tablet 30 tablet 0    Sig: Take 1 tablet (10 mg total) by mouth daily. Please schedule appt for further refills.     Cardiovascular: Calcium Channel Blockers 2 Failed - 03/24/2024  2:21 PM      Failed - Valid encounter within last 6 months  Recent Outpatient Visits           11 months ago Cervical cancer screening   [place] [facility]  Grayce Sessions, NP   1 year ago Colon cancer screening   [place] [facility] Grayce Sessions, NP   2 years ago Class 2 severe obesity due to excess calories with serious comorbidity in adult, unspecified BMI (HCC)   [place] [facility] Grayce Sessions, NP   4 years ago Hypertension, unspecified type   [place] [facility] Grayce Sessions, NP   4 years ago Poor dentition   [place] [facility] Grayce Sessions, NP              Passed - Last BP in normal range    BP Readings from Last 1 Encounters:  04/19/23 115/78         Passed - Last Heart Rate in normal range    Pulse Readings from Last 1 Encounters:  04/19/23 80          rosuvastatin (CRESTOR) 40 MG tablet 30 tablet 0    Sig: Take 1 tablet (40 mg total) by mouth daily. Please schedule appt for further refills.     Cardiovascular:  Antilipid - Statins 2 Failed - 03/24/2024  2:21 PM      Failed - Cr in normal range and within 360 days    Creatinine, Ser  Date Value Ref Range Status  02/11/2023 0.99 0.57 - 1.00 mg/dL Final   Creatinine, Urine  Date Value Ref Range Status  03/07/2018 295.87 mg/dL Final    Comment:    Performed at [facility], [address], [place], [place] [address]         Failed - Lipid Panel in normal range within the last 12 months    Cholesterol, Total  Date Value Ref Range Status  02/11/2023 241 (H) 100 - 199 mg/dL Final   LDL Chol Calc (NIH)  Date Value Ref Range Status  02/11/2023 149 (H) 0 - 99 mg/dL Final   HDL  Date Value Ref Range Status  02/11/2023 64 >39 mg/dL Final   Triglycerides  Date Value Ref Range Status  02/11/2023 156 (H) 0 - 149 mg/dL Final         Passed - Patient is not pregnant      Passed - Valid encounter within last 12 months    Recent Outpatient Visits           11 months ago Cervical cancer screening   [place] [facility]  Grayce Sessions, NP   1 year ago Colon cancer screening   [place] [facility] Grayce Sessions, NP   2 years ago Class 2 severe obesity due to excess calories with serious comorbidity in adult, unspecified BMI (HCC)   [place] [facility] Grayce Sessions, NP   4 years ago Hypertension, unspecified type   [place] [facility] Grayce Sessions, NP   4 years ago Poor dentition    [facility], NP

## 2024-03-24 NOTE — Telephone Encounter (Signed)
 Courtesy RF supplied 01/14/24- needs appointment Requested Prescriptions  Pending Prescriptions Disp Refills   amLODipine (NORVASC) 10 MG tablet 30 tablet 0    Sig: Take 1 tablet (10 mg total) by mouth daily. Please schedule appt for further refills.     Cardiovascular: Calcium Channel Blockers 2 Failed - 03/24/2024  2:18 PM      Failed - Valid encounter within last 6 months    Recent Outpatient Visits           11 months ago Cervical cancer screening   [place] [facility] Grayce Sessions, NP   1 year ago Colon cancer screening   [place] [facility] Grayce Sessions, NP   2 years ago Class 2 severe obesity due to excess calories with serious comorbidity in adult, unspecified BMI (HCC)   [place] [facility] Grayce Sessions, NP   4 years ago Hypertension, unspecified type   [place] [facility] Grayce Sessions, NP   4 years ago Poor dentition   [place] [facility] Grayce Sessions, NP              Passed - Last BP in normal range    BP Readings from Last 1 Encounters:  04/19/23 115/78         Passed - Last Heart Rate in normal range    Pulse Readings from Last 1 Encounters:  04/19/23 80          rosuvastatin (CRESTOR) 40 MG tablet 30 tablet 0    Sig: Take 1 tablet (40 mg total) by mouth daily. Please schedule appt for further refills.     Cardiovascular:  Antilipid - Statins 2 Failed - 03/24/2024  2:18 PM      Failed - Cr in normal range and within 360 days    Creatinine, Ser  Date Value Ref Range Status  02/11/2023 0.99 0.57 - 1.00 mg/dL Final   Creatinine, Urine  Date Value Ref Range Status  03/07/2018 295.87 mg/dL Final    Comment:    Performed at [facility], [address], [place], [place] [address]         Failed - Lipid Panel in normal range within the last 12 months    Cholesterol, Total  Date Value Ref Range Status  02/11/2023 241  (H) 100 - 199 mg/dL Final   LDL Chol Calc (NIH)  Date Value Ref Range Status  02/11/2023 149 (H) 0 - 99 mg/dL Final   HDL  Date Value Ref Range Status  02/11/2023 64 >39 mg/dL Final   Triglycerides  Date Value Ref Range Status  02/11/2023 156 (H) 0 - 149 mg/dL Final         Passed - Patient is not pregnant      Passed - Valid encounter within last 12 months    Recent Outpatient Visits           11 months ago Cervical cancer screening   [place] [facility] Grayce Sessions, NP   1 year ago Colon cancer screening   [place] [facility] Grayce Sessions, NP   2 years ago Class 2 severe obesity due to excess calories with serious comorbidity in adult, unspecified BMI (HCC)   [place] [facility] Grayce Sessions, NP   4 years ago Hypertension, unspecified type    [facility], NP   4  years ago Poor dentition   [place] [facility] Grayce Sessions, NP               metoprolol tartrate (LOPRESSOR) 100 MG tablet 180 tablet 3    Sig: Take 1 tablet (100 mg total) by mouth 2 (two) times daily.     Cardiovascular:  Beta Blockers Failed - 03/24/2024  2:18 PM      Failed - Valid encounter within last 6 months    Recent Outpatient Visits           11 months ago Cervical cancer screening   [place] [facility] Grayce Sessions, NP   1 year ago Colon cancer screening   [place] [facility] Grayce Sessions, NP   2 years ago Class 2 severe obesity due to excess calories with serious comorbidity in adult, unspecified BMI (HCC)   [place] [facility] Grayce Sessions, NP   4 years ago Hypertension, unspecified type   [place] [facility] Grayce Sessions, NP   4 years ago Poor dentition    [facility]ce Sessions, NP               Passed - Last BP in normal range    BP Readings from Last 1 Encounters:  04/19/23 115/78         Passed - Last Heart Rate in normal range    Pulse Readings from Last 1 Encounters:  04/19/23 80

## 2024-03-24 NOTE — Telephone Encounter (Signed)
 Requested medication (s) are due for refill today - unknown  Requested medication (s) are on the active medication list -no  Future visit scheduled -no  Last refill: unknown  Notes to clinic: medication not listed on current medication list  Requested Prescriptions  Pending Prescriptions Disp Refills   metoprolol tartrate (LOPRESSOR) 100 MG tablet 180 tablet 3    Sig: Take 1 tablet (100 mg total) by mouth 2 (two) times daily.     Cardiovascular:  Beta Blockers Failed - 03/24/2024  2:57 PM      Failed - Valid encounter within last 6 months    Recent Outpatient Visits           11 months ago Cervical cancer screening   [place] [facility] Grayce Sessions, NP   1 year ago Colon cancer screening   [place] [facility] Grayce Sessions, NP   2 years ago Class 2 severe obesity due to excess calories with serious comorbidity in adult, unspecified BMI (HCC)   [place] [facility] Grayce Sessions, NP   4 years ago Hypertension, unspecified type   [place] [facility] Grayce Sessions, NP   4 years ago Poor dentition   [place] [facility] Grayce Sessions, NP              Passed - Last BP in normal range    BP Readings from Last 1 Encounters:  04/19/23 115/78         Passed - Last Heart Rate in normal range    Pulse Readings from Last 1 Encounters:  04/19/23 80         Refused Prescriptions Disp Refills   amLODipine (NORVASC) 10 MG tablet 30 tablet 0    Sig: Take 1 tablet (10 mg total) by mouth daily. Please schedule appt for further refills.     Cardiovascular: Calcium Channel Blockers 2 Failed - 03/24/2024  2:57 PM      Failed - Valid encounter within last 6 months    Recent Outpatient Visits           11 months ago Cervical cancer screening   [place] [facility] Grayce Sessions, NP   1 year ago Colon cancer screening   [facility] Grayce Sessions, NP   2 years ago Class 2 severe obesity due to excess calories with serious comorbidity in adult, unspecified BMI (HCC)   [place] [facility] Grayce Sessions, NP   4 years ago Hypertension, unspecified type   [place] [facility] Grayce Sessions, NP   4 years ago Poor dentition   [place] [facility] Grayce Sessions, NP              Passed - Last BP in normal range    BP Readings from Last 1 Encounters:  04/19/23 115/78         Passed - Last Heart Rate in normal range    Pulse Readings from Last 1 Encounters:  04/19/23 80          rosuvastatin (CRESTOR) 40 MG tablet 30 tablet 0    Sig: Take 1 tablet (40 mg total) by mouth daily. Please schedule appt for further refills.     Cardiovascular:  Antilipid - Statins 2 Failed - 03/24/2024  2:57 PM      Failed - Cr in normal range and within  360 days    Creatinine, Ser  Date Value Ref Range Status  02/11/2023 0.99 0.57 - 1.00 mg/dL Final   Creatinine, Urine  Date Value Ref Range Status  03/07/2018 295.87 mg/dL Final    Comment:    Performed at [facility], [address], [place], [place] [address]         Failed - Lipid Panel in normal range within the last 12 months    Cholesterol, Total  Date Value Ref Range Status  02/11/2023 241 (H) 100 - 199 mg/dL Final   LDL Chol Calc (NIH)  Date Value Ref Range Status  02/11/2023 149 (H) 0 - 99 mg/dL Final   HDL  Date Value Ref Range Status  02/11/2023 64 >39 mg/dL Final   Triglycerides  Date Value Ref Range Status  02/11/2023 156 (H) 0 - 149 mg/dL Final         Passed - Patient is not pregnant      Passed - Valid encounter within last 12 months    Recent Outpatient Visits           11 months ago Cervical cancer screening   [place] [facility] Grayce Sessions, NP   1 year ago Colon cancer screening   [place]  [facility] Sessions, NP   2 years ago Class 2 severe obesity due to excess calories with serious comorbidity in adult, unspecified BMI (HCC)   [place] [facility] Grayce Sessions, NP   4 years ago Hypertension, unspecified type   [place] [facility] Grayce Sessions, NP   4 years ago Poor dentition   [place] [facility] Grayce Sessions, NP                 Requested Prescriptions  Pending Prescriptions Disp Refills   metoprolol tartrate (LOPRESSOR) 100 MG tablet 180 tablet 3    Sig: Take 1 tablet (100 mg total) by mouth 2 (two) times daily.     Cardiovascular:  Beta Blockers Failed - 03/24/2024  2:57 PM      Failed - Valid encounter within last 6 months    Recent Outpatient Visits           11 months ago Cervical cancer screening   [place] [facility] Grayce Sessions, NP   1 year ago Colon cancer screening   [place] [facility] Grayce Sessions, NP   2 years ago Class 2 severe obesity due to excess calories with serious comorbidity in adult, unspecified BMI (HCC)   [place] [facility] Grayce Sessions, NP   4 years ago Hypertension, unspecified type   [place] [facility] Grayce Sessions, NP   4 years ago Poor dentition   [place] [facility] Grayce Sessions, NP              Passed - Last BP in normal range    BP Readings from Last 1 Encounters:  04/19/23 115/78         Passed - Last Heart Rate in normal range    Pulse Readings from Last 1 Encounters:  04/19/23 80         Refused Prescriptions Disp Refills   amLODipine (NORVASC) 10 MG tablet 30 tablet 0    Sig: Take 1 tablet (10 mg total) by mouth daily. Please schedule appt for further refills.  Cardiovascular: Calcium Channel Blockers 2 Failed - 03/24/2024  2:57 PM      Failed - Valid  encounter within last 6 months    Recent Outpatient Visits           11 months ago Cervical cancer screening   [place] [facility] Grayce Sessions, NP   1 year ago Colon cancer screening   [place] [facility] Grayce Sessions, NP   2 years ago Class 2 severe obesity due to excess calories with serious comorbidity in adult, unspecified BMI (HCC)   [place] [facility] Grayce Sessions, NP   4 years ago Hypertension, unspecified type   [place] [facility] Grayce Sessions, NP   4 years ago Poor dentition   [place] [facility] Grayce Sessions, NP              Passed - Last BP in normal range    BP Readings from Last 1 Encounters:  04/19/23 115/78         Passed - Last Heart Rate in normal range    Pulse Readings from Last 1 Encounters:  04/19/23 80          rosuvastatin (CRESTOR) 40 MG tablet 30 tablet 0    Sig: Take 1 tablet (40 mg total) by mouth daily. Please schedule appt for further refills.     Cardiovascular:  Antilipid - Statins 2 Failed - 03/24/2024  2:57 PM      Failed - Cr in normal range and within 360 days    Creatinine, Ser  Date Value Ref Range Status  02/11/2023 0.99 0.57 - 1.00 mg/dL Final   Creatinine, Urine  Date Value Ref Range Status  03/07/2018 295.87 mg/dL Final    Comment:    Performed at [facility], [address], [place], [place] [address]         Failed - Lipid Panel in normal range within the last 12 months    Cholesterol, Total  Date Value Ref Range Status  02/11/2023 241 (H) 100 - 199 mg/dL Final   LDL Chol Calc (NIH)  Date Value Ref Range Status  02/11/2023 149 (H) 0 - 99 mg/dL Final   HDL  Date Value Ref Range Status  02/11/2023 64 >39 mg/dL Final   Triglycerides  Date Value Ref Range Status  02/11/2023 156 (H) 0 - 149 mg/dL Final         Passed - Patient is not pregnant      Passed - Valid  encounter within last 12 months    Recent Outpatient Visits           11 months ago Cervical cancer screening   [place] [facility] Grayce Sessions, NP   1 year ago Colon cancer screening   [place] [facility] Grayce Sessions, NP   2 years ago Class 2 severe obesity due to excess calories with serious comorbidity in adult, unspecified BMI (HCC)   [place] [facility] Grayce Sessions, NP   4 years ago Hypertension, unspecified type   [place] [facility] Grayce Sessions, NP   4 years ago Poor dentition   [place] [facility] Grayce Sessions, NP

## 2024-04-13 ENCOUNTER — Ambulatory Visit: Payer: 59

## 2024-04-14 ENCOUNTER — Other Ambulatory Visit (INDEPENDENT_AMBULATORY_CARE_PROVIDER_SITE_OTHER): Payer: Self-pay | Admitting: Primary Care

## 2024-04-14 DIAGNOSIS — I10 Essential (primary) hypertension: Secondary | ICD-10-CM

## 2024-04-14 DIAGNOSIS — Z760 Encounter for issue of repeat prescription: Secondary | ICD-10-CM

## 2024-04-25 ENCOUNTER — Ambulatory Visit

## 2024-04-27 ENCOUNTER — Ambulatory Visit

## 2024-05-03 ENCOUNTER — Ambulatory Visit
Admission: EM | Admit: 2024-05-03 | Discharge: 2024-05-03 | Disposition: A | Discharge status: Home / Self Care | Attending: Family Medicine | Admitting: Family Medicine

## 2024-05-03 DIAGNOSIS — Z711 Person with feared health complaint in whom no diagnosis is made: Secondary | ICD-10-CM | POA: Diagnosis not present

## 2024-05-03 DIAGNOSIS — I10 Essential (primary) hypertension: Secondary | ICD-10-CM

## 2024-05-03 LAB — POCT URINALYSIS DIP (MANUAL ENTRY)
Bilirubin, UA: NEGATIVE
Blood, UA: NEGATIVE
Glucose, UA: NEGATIVE mg/dL
Leukocytes, UA: NEGATIVE
Nitrite, UA: NEGATIVE
Protein Ur, POC: 30 mg/dL — AB
Spec Grav, UA: 1.025 (ref 1.010–1.025)
Urobilinogen, UA: 1 U/dL
pH, UA: 6 (ref 5.0–8.0)

## 2024-05-03 NOTE — ED Triage Notes (Signed)
 Pt presents for UTI and BP check. Denies any symptoms.

## 2024-05-03 NOTE — Discharge Instructions (Signed)
 Your urine is negative for an infection.  Please contact your primary care physician who refills your blood pressure medication to have them send you additional refills as well as follow-up.

## 2024-05-03 NOTE — ED Provider Notes (Signed)
 [facility]    CSN: [id number] Arrival date & time: 05/03/24  1408      History   Chief Complaint No chief complaint on file.   HPI Tracey Ramos is a 68 y.o. female presents for blood pressure check and screening for UTI.  Patient has a history of hypertension and has previously been on amlodipine  10 mg.  Chart reveals she called her PCP in February requesting a refill and was told she needed to come in for a visit, however, patient did not do this.   Denies any headaches, chest pain, dizziness.  In addition she wants to be checked for UTI.  She denies any symptoms including dysuria, hematuria, nausea/vomiting, flank pain.  Denies history of asymptomatic UTIs.  She just "wants to get checked".  No other concerns at this time.  HPI  Past Medical History:  Diagnosis Date   Cholelithiasis    CKD (chronic kidney disease), stage II    Hernia of abdominal wall    Hypertension     Patient Active Problem List   Diagnosis Date Noted   Syncope 03/06/2018   Acute lower UTI 03/06/2018   Hypokalemia 03/06/2018   Acute renal failure superimposed on stage 2 chronic kidney disease (HCC) 03/06/2018   Hypertensive urgency 03/06/2018   Hypertension    Acute cystitis without hematuria    Tachycardia     Past Surgical History:  Procedure Laterality Date   APPENDECTOMY      OB History   No obstetric history on file.      Home Medications    Prior to Admission medications   Medication Sig Start Date End Date Taking? Authorizing Provider  amLODipine  (NORVASC ) 10 MG tablet Take 1 tablet (10 mg total) by mouth daily. Please schedule appt for further refills. 01/14/24   Marius Siemens, NP  OLANZapine  (ZYPREXA ) 15 MG tablet Take 15 mg by mouth at bedtime.    [provider]  rosuvastatin  (CRESTOR ) 40 MG tablet Take 1 tablet (40 mg total) by mouth daily. Please schedule appt for further refills. 01/14/24   Marius Siemens, NP    Family History Family History   Problem Relation Age of Onset   Depression Mother    Hypertension Father    Heart attack Father     Social History Social History   Tobacco Use   Smoking status: Former    Types: Cigarettes   Smokeless tobacco: Never  Vaping Use   Vaping status: Never Used  Substance Use Topics   Alcohol use: Yes   Drug use: Yes    Types: Marijuana     Allergies   Penicillins   Review of Systems Review of Systems  Cardiovascular:        Blood pressure check  Genitourinary:        Wants to get checked for UTI but is asymptomatic     Physical Exam Triage Vital Signs ED Triage Vitals [05/03/24 1432]  Encounter Vitals Group     BP (!) 145/91     Systolic BP Percentile      Diastolic BP Percentile      Pulse Rate 95     Resp 16     Temp 98.9 F (37.2 C)     Temp Source Oral     SpO2 93 %     Weight      Height      Head Circumference      Peak Flow      Pain  Score 0     Pain Loc      Pain Education      Exclude from Growth Chart    No data found.  Updated Vital Signs BP (!) 145/91 (BP Location: Left Arm)   Pulse 95   Temp 98.9 F (37.2 C) (Oral)   Resp 16   SpO2 93%   Visual Acuity Right Eye Distance:   Left Eye Distance:   Bilateral Distance:    Right Eye Near:   Left Eye Near:    Bilateral Near:     Physical Exam Vitals and nursing note reviewed.  Constitutional:      Appearance: Normal appearance.     Comments: Flat affect  HENT:     Head: Normocephalic and atraumatic.  Eyes:     Pupils: Pupils are equal, round, and reactive to light.  Cardiovascular:     Rate and Rhythm: Normal rate.  Pulmonary:     Effort: Pulmonary effort is normal.  Abdominal:     Tenderness: There is no right CVA tenderness or left CVA tenderness.  Skin:    General: Skin is warm and dry.  Neurological:     General: No focal deficit present.     Mental Status: She is alert and oriented to person, place, and time.  Psychiatric:        Mood and Affect: Mood normal.         Behavior: Behavior normal.      UC Treatments / Results  Labs (all labs ordered are listed, but only abnormal results are displayed) Labs Reviewed  POCT URINALYSIS DIP (MANUAL ENTRY) - Abnormal; Notable for the following components:      Result Value   Ketones, POC UA small (15) (*)    Protein Ur, POC =30 (*)    All other components within normal limits    EKG   Radiology No results found.  Procedures Procedures (including critical care time)  Medications Ordered in UC Medications - No data to display  Initial Impression / Assessment and Plan / UC Course  I have reviewed the triage vital signs and the nursing notes.  Pertinent labs & imaging results that were available during my care of the patient were reviewed by me and considered in my medical decision making (see chart for details).     I reviewed concerns with patient.  UA is negative for UTI.  Blood pressure stable.  Advise she contact her PCP for refills of her amlodipine  and for follow-up to make sure that she has been safely treated for her blood pressure.  She verbalized understanding.  ER precautions reviewed. Final Clinical Impressions(s) / UC Diagnoses   Final diagnoses:  Essential hypertension  Feared complaint without diagnosis     Discharge Instructions      Your urine is negative for an infection.  Please contact your primary care physician who refills your blood pressure medication to have them send you additional refills as well as follow-up.    ED Prescriptions   None    PDMP not reviewed this encounter.   Alleen Arbour, NP 05/03/24 [phone]

## 2024-05-04 ENCOUNTER — Emergency Department (HOSPITAL_COMMUNITY)
Admission: EM | Admit: 2024-05-04 | Discharge: 2024-05-05 | Disposition: A | Discharge status: Other Acute Inpatient Hospital | Attending: Emergency Medicine | Admitting: Emergency Medicine

## 2024-05-04 ENCOUNTER — Emergency Department (HOSPITAL_COMMUNITY)

## 2024-05-04 ENCOUNTER — Encounter (HOSPITAL_COMMUNITY): Payer: Self-pay

## 2024-05-04 ENCOUNTER — Other Ambulatory Visit: Payer: Self-pay

## 2024-05-04 DIAGNOSIS — F419 Anxiety disorder, unspecified: Secondary | ICD-10-CM

## 2024-05-04 DIAGNOSIS — F1210 Cannabis abuse, uncomplicated: Secondary | ICD-10-CM

## 2024-05-04 DIAGNOSIS — F430 Acute stress reaction: Secondary | ICD-10-CM

## 2024-05-04 DIAGNOSIS — F1290 Cannabis use, unspecified, uncomplicated: Secondary | ICD-10-CM

## 2024-05-04 DIAGNOSIS — Z91148 Patient's other noncompliance with medication regimen for other reason: Secondary | ICD-10-CM | POA: Diagnosis not present

## 2024-05-04 DIAGNOSIS — R4182 Altered mental status, unspecified: Secondary | ICD-10-CM | POA: Diagnosis present

## 2024-05-04 DIAGNOSIS — I6782 Cerebral ischemia: Secondary | ICD-10-CM | POA: Diagnosis not present

## 2024-05-04 HISTORY — DX: Cannabis abuse, uncomplicated: F12.10

## 2024-05-04 LAB — URINALYSIS, ROUTINE W REFLEX MICROSCOPIC
Bilirubin Urine: NEGATIVE
Glucose, UA: NEGATIVE mg/dL
Hgb urine dipstick: NEGATIVE
Ketones, ur: NEGATIVE mg/dL
Nitrite: NEGATIVE
Protein, ur: NEGATIVE mg/dL
Specific Gravity, Urine: 1.016 (ref 1.005–1.030)
pH: 5 (ref 5.0–8.0)

## 2024-05-04 LAB — COMPREHENSIVE METABOLIC PANEL WITH GFR
ALT: 17 U/L (ref 0–44)
AST: 28 U/L (ref 15–41)
Albumin: 3.7 g/dL (ref 3.5–5.0)
Alkaline Phosphatase: 65 U/L (ref 38–126)
Anion gap: 10 (ref 5–15)
BUN: 17 mg/dL (ref 8–23)
CO2: 23 mmol/L (ref 22–32)
Calcium: 9.1 mg/dL (ref 8.9–10.3)
Chloride: 105 mmol/L (ref 98–111)
Creatinine, Ser: 1.1 mg/dL — ABNORMAL HIGH (ref 0.44–1.00)
GFR, Estimated: 55 mL/min — ABNORMAL LOW (ref >60)
Glucose, Bld: 98 mg/dL (ref 70–99)
Potassium: 3.7 mmol/L (ref 3.5–5.1)
Sodium: 138 mmol/L (ref 135–145)
Total Bilirubin: 0.8 mg/dL (ref 0.0–1.2)
Total Protein: 7.2 g/dL (ref 6.5–8.1)

## 2024-05-04 LAB — RAPID URINE DRUG SCREEN, HOSP PERFORMED
Amphetamines: NOT DETECTED
Barbiturates: NOT DETECTED
Benzodiazepines: NOT DETECTED
Cocaine: NOT DETECTED
Opiates: NOT DETECTED
Tetrahydrocannabinol: POSITIVE — AB

## 2024-05-04 LAB — RESP PANEL BY RT-PCR (RSV, FLU A&B, COVID)  RVPGX2
Influenza A by PCR: NEGATIVE
Influenza B by PCR: NEGATIVE
Resp Syncytial Virus by PCR: NEGATIVE
SARS Coronavirus 2 by RT PCR: NEGATIVE

## 2024-05-04 LAB — CBC WITH DIFFERENTIAL/PLATELET
Abs Immature Granulocytes: 0.02 10*3/uL (ref 0.00–0.07)
Basophils Absolute: 0 10*3/uL (ref 0.0–0.1)
Basophils Relative: 0 %
Eosinophils Absolute: 0.1 10*3/uL (ref 0.0–0.5)
Eosinophils Relative: 1 %
HCT: 39.2 % (ref 36.0–46.0)
Hemoglobin: 12.8 g/dL (ref 12.0–15.0)
Immature Granulocytes: 0 %
Lymphocytes Relative: 33 %
Lymphs Abs: 2.4 10*3/uL (ref 0.7–4.0)
MCH: 31.1 pg (ref 26.0–34.0)
MCHC: 32.7 g/dL (ref 30.0–36.0)
MCV: 95.1 fL (ref 80.0–100.0)
Monocytes Absolute: 0.8 10*3/uL (ref 0.1–1.0)
Monocytes Relative: 11 %
Neutro Abs: 3.9 10*3/uL (ref 1.7–7.7)
Neutrophils Relative %: 55 %
Platelets: 224 10*3/uL (ref 150–400)
RBC: 4.12 MIL/uL (ref 3.87–5.11)
RDW: 13.2 % (ref 11.5–15.5)
WBC: 7.1 10*3/uL (ref 4.0–10.5)
nRBC: 0 % (ref 0.0–0.2)

## 2024-05-04 LAB — AMMONIA: Ammonia: 18 umol/L (ref 9–35)

## 2024-05-04 LAB — ETHANOL: Alcohol, Ethyl (B): <15 mg/dL (ref <15)

## 2024-05-04 MED ORDER — AMLODIPINE BESYLATE 5 MG PO TABS
10.0000 mg | ORAL_TABLET | Freq: Every day | ORAL | Status: DC
  Administered 2024-05-04 – 2024-05-05 (×2): 10 mg via ORAL
  Filled 2024-05-04 (×2): qty 2

## 2024-05-04 MED ORDER — OLANZAPINE 5 MG PO TABS
15.0000 mg | ORAL_TABLET | Freq: Every day | ORAL | Status: DC
  Administered 2024-05-04: 15 mg via ORAL
  Filled 2024-05-04: qty 1

## 2024-05-04 MED ORDER — ROSUVASTATIN CALCIUM 5 MG PO TABS
40.0000 mg | ORAL_TABLET | Freq: Every day | ORAL | Status: DC
Start: 2024-05-04 — End: 2024-05-05
  Administered 2024-05-04 – 2024-05-05 (×2): 40 mg via ORAL
  Filled 2024-05-04 (×2): qty 8

## 2024-05-04 NOTE — ED Triage Notes (Signed)
 Patient comes in from home via EMS for altered mental status. Per EMS, EMS was called because patient woke up from a nap and stated to call 911 that she is having a stroke. Per family, patient has been acting odd for the past few weeks. Patient has mental health history and has not been complaints with meds. Patient is AO X 4. Patient has flat affect.

## 2024-05-04 NOTE — ED Notes (Signed)
 Attempted to call report for 2nd time. Nurse asked if she could call back when ready to get report.

## 2024-05-04 NOTE — ED Provider Notes (Signed)
 BH team indicates patient accepted to [facility], Dr Jadapalle.   Patient is awake and alert, denies specific physical c/o or symptoms.  Breathing comfortably, no acute distress. Bp normal.   Pt appears stable for transfer/admit to [facility].      Guadalupe Lee, MD 05/04/24 [phone]

## 2024-05-04 NOTE — ED Notes (Signed)
 Pt requested to speak to the nurse, when trying to converse with pt, pt would stare and not say anything. Asked if she had pain, & she did respond by saying "no, I don't have no pain". She did state, "I just don't think I'll be here no more". When asking if she feels like she going to die, pt shook her head yes. Denies SI/HI when asking. Pt wouldn't respond to any further questions.

## 2024-05-04 NOTE — ED Notes (Signed)
 Attempted to call report again because Safe transport has been called. Was told that they will call [facility] because they are unable to take patient due to staffing. Charge RN made aware.

## 2024-05-04 NOTE — ED Notes (Signed)
 Attempted report # 3. [facility] unable to take pt at this time. Will proceed for transfer at 2000. Team aware

## 2024-05-04 NOTE — ED Notes (Signed)
 Attempted to call report. [facility] staff is in an emergency and asked if we could give them some time before calling report.

## 2024-05-04 NOTE — BH Assessment (Addendum)
 Comprehensive Clinical Assessment (CCA) Note  05/04/2024 Tracey Ramos [id number]  Disposition: Bonnita Buttner, NP, recommends geropsychiatry inpatient treatment. Disposition SW to secure placement.   The patient demonstrates the following risk factors for suicide: Chronic risk factors for suicide include: psychiatric disorder of bipolar. Acute risk factors for suicide include: N/A. Protective factors for this patient include: responsibility to others (children, family) and hope for the future. Considering these factors, the overall suicide risk at this point appears to be moderate. Patient is not appropriate for outpatient follow up.  Tracey Ramos is a 68 year old female presenting voluntary to [facility] due altered mental status. Per EMS, "EMS was called because patient woke up from a nap and stated to call 911 that she is having a stroke. Per family, patient has been acting odd for the past few weeks. Patient has mental health history and has not been complaints with meds. Patient is AO X 4. Patient has flat affect".  During assessment patient was flat and staring at clinician. At times patient did not respond to questions. Patient reports feeling paranoid, unable to share timeframe of onset. Patient reports using marijuana, amount and timeframe unknown. Patient reports receiving bipolar medications from PCP at [facility] and that she has an upcoming appointment, unable to recall date and time. Patient reports she has been out of her psych medications and needs more. Patient unable to share last time she was off her medications. Patient reports getting little sleep. Patient reports normal appetite. Patient resides alone. Patient has one daughter and reports having a "great" relationship. Patient reports feeling safe to go home. Patient denied access to guns. Patient was calm and cooperative during assessment.  Collateral contact Derek Biermann, daughter, [phone] Unable to contact  will attempt at later time, message "your call could not be completed as dialed".   Chief Complaint:  Chief Complaint  Patient presents with   Altered Mental Status   Visit Diagnosis:  Altered mental status History of bipolar    CCA Screening, Triage and Referral (STR)  Patient Reported Information How did you hear about us ? Family/Friend  What Is the Reason for Your Visit/Call Today? Altered mental status  How Long Has This Been Causing You Problems? <Week  What Do You Feel Would Help You the Most Today? Treatment for Depression or other mood problem   Have You Recently Had Any Thoughts About Hurting Yourself? No  Are You Planning to Commit Suicide/Harm Yourself At This time? No   Flowsheet Row ED from 05/04/2024 in [facility] Department at [facility] UC from 05/03/2024 in [facility] Ur[facility] Care at [facility] ED from 08/06/2022 in [facility] Emergency Department at [facility]  C-SSRS RISK CATEGORY No Risk No Risk No Risk       Have you Recently Had Thoughts About Hurting Someone Marigene Shoulder? No  Are You Planning to Harm Someone at This Time? No  Explanation: n/a   Have You Used Any Alcohol or Drugs in the Past 24 Hours? No  How Long Ago Did You Use Drugs or Alcohol? N/a What Did You Use and How Much? N/a  Do You Currently Have a Therapist/Psychiatrist? No  Name of Therapist/Psychiatrist:  n/a  Have You Been Recently Discharged From Any Office Practice or Programs? No  Explanation of Discharge From Practice/Program: n/a    CCA Screening Triage Referral Assessment Type of Contact: Tele-Assessment  Telemedicine Service Delivery: Telemedicine service delivery: This service was provided via telemedicine using a 2-way, interactive  audio and video technology  Is this Initial or Reassessment? Is this Initial or Reassessment?: Initial Assessment  Date Telepsych consult ordered in [facility]:  Date Telepsych consult ordered in [facility]:  05/04/24  Time Telepsych consult ordered in [facility]:  Time Telepsych consult ordered in [facility]: 0220  Location of Assessment: [facility] ED  Provider Location: [facility] Assessment Services   Collateral Involvement: n/a   Does Patient Have a Automotive engineer Guardian? No  Legal Guardian Contact Information: n/a  Copy of Legal Guardianship Form: -- (n/a)  Legal Guardian Notified of Arrival: -- (n/a)  Legal Guardian Notified of Pending Discharge: -- (n/a)  If Minor and Not Living with Parent(s), Who has Custody? n/a  Is CPS involved or ever been involved? Never  Is APS involved or ever been involved? Never   Patient Determined To Be At Risk for Harm To Self or Others Based on Review of Patient Reported Information or Presenting Complaint? No  Method: No Plan  Availability of Means: No access or NA  Intent: Vague intent or NA  Notification Required: No need or identified person  Additional Information for Danger to Others Potential: -- (n/a)  Additional Comments for Danger to Others Potential: n/a  Are There Guns or Other Weapons in Your Home? No  Types of Guns/Weapons: n/a  Are These Weapons Safely Secured?                            No  Who Could Verify You Are Able To Have These Secured: n/a  Do You Have any Outstanding Charges, Pending Court Dates, Parole/Probation? none reported  Contacted To Inform of Risk of Harm To Self or Others: Family/Significant Other:    Does Patient Present under Involuntary Commitment? No    [place] of Residence: [place]   Patient Currently Receiving the Following Services: Medication Management   Determination of Need: Urgent (48 hours)   Options For Referral: Inpatient Hospitalization; Medication Management; Outpatient Therapy     CCA Biopsychosocial Patient Reported Schizophrenia/Schizoaffective Diagnosis in Past: No   Strengths: seeking treatment   Mental Health Symptoms Depression:  Fatigue; Difficulty  Concentrating   Duration of Depressive symptoms: Duration of Depressive Symptoms: Greater than two weeks   Mania:  None   Anxiety:   Worrying; Tension; Sleep; Restlessness; Fatigue; Difficulty concentrating   Psychosis:  None   Duration of Psychotic symptoms:    Trauma:  None   Obsessions:  None   Compulsions:  None   Inattention:  None   Hyperactivity/Impulsivity:  None   Oppositional/Defiant Behaviors:  None   Emotional Irregularity:  None   Other Mood/Personality Symptoms:  n/a    Mental Status Exam Appearance and self-care  Stature:  Average   Weight:  Average weight   Clothing:  Neat/clean   Grooming:  Normal   Cosmetic use:  None   Posture/gait:  Normal   Motor activity:  Slowed   Sensorium  Attention:  Confused   Concentration:  Anxiety interferes   Orientation:  n/a  Recall/memory:  Defective in Short-term   Affect and Mood  Affect:  Flat; Labile   Mood:  Anxious; Depressed   Relating  Eye contact:  Staring   Facial expression:  Anxious; Responsive   Attitude toward examiner:  Cooperative   Thought and Language  Speech flow: Normal   Thought content:  Appropriate to Mood and Circumstances   Preoccupation:  None   Hallucinations:  None   Organization:  Coherent   [employer] of Knowledge:  Average   Intelligence:  Average   Abstraction:  Functional   Judgement:  Impaired   Reality Testing:  Adequate   Insight:  Lacking   Decision Making:  Confused   Social Functioning  Social Maturity:  Isolates   Social Judgement:  Naive   Stress  Stressors:  Illness   Coping Ability:  Human resources officer Deficits:  uta  Supports:  Family     Religion: Religion/Spirituality Are You A Religious Person?: Yes How Might This Affect Treatment?: none  Leisure/Recreation: Leisure / Recreation Do You Have Hobbies?: Yes Leisure and Hobbies: computer  Exercise/Diet: Exercise/Diet Do You Exercise?: No Have  You Gained or Lost A Significant Amount of Weight in the Past Six Months?: No Do You Follow a Special Diet?: No Do You Have Any Trouble Sleeping?: Yes Explanation of Sleeping Difficulties: "very little"   CCA Employment/Education Employment/Work Situation: Employment / Work Situation Employment Situation: On disability Why is Patient on Disability: uta How Long has Patient Been on Disability: uta Patient's Job has Been Impacted by Current Illness:  (n/a) Has Patient ever Been in the [employer]?: No  Education: Education Is Patient Currently Attending School?: No Last Grade Completed: 12 Did You Attend College?:  (uta) Did You Have An Individualized Education Program (IIEP): No Did You Have Any Difficulty At School?: No Patient's Education Has Been Impacted by Current Illness: No   CCA Family/Childhood History Family and Relationship History: Family history Marital status: Single Does patient have children?: Yes How many children?: 1 How is patient's relationship with their children?: "great"  Childhood History:  Childhood History By whom was/is the patient raised?: Mother Did patient suffer any verbal/emotional/physical/sexual abuse as a child?: No Did patient suffer from severe childhood neglect?: No Has patient ever been sexually abused/assaulted/raped as an adolescent or adult?: No Was the patient ever a victim of a crime or a disaster?: No Witnessed domestic violence?: No Has patient been affected by domestic violence as an adult?: No       CCA Substance Use Alcohol/Drug Use: Alcohol / Drug Use Pain Medications: see MAR Prescriptions: see MAR Over the Counter: see MAR History of alcohol / drug use?: Yes Longest period of sobriety (when/how long): uta Negative Consequences of Use:  (uta) Withdrawal Symptoms: None Substance #1 Name of Substance 1: marijuana, uta frequency                       ASAM's:  Six Dimensions of Multidimensional  Assessment  Dimension 1:  Acute Intoxication and/or Withdrawal Potential:   Dimension 1:  Description of individual's past and current experiences of substance use and withdrawal: uta  Dimension 2:  Biomedical Conditions and Complications:   Dimension 2:  Description of patient's biomedical conditions and  complications: uta  Dimension 3:  Emotional, Behavioral, or Cognitive Conditions and Complications:  Dimension 3:  Description of emotional, behavioral, or cognitive conditions and complications: uta  Dimension 4:  Readiness to Change:  Dimension 4:  Description of Readiness to Change criteria: uta  Dimension 5:  Relapse, Continued use, or Continued Problem Potential:  Dimension 5:  Relapse, continued use, or continued problem potential critiera description: uta  Dimension 6:  Recovery/Living Environment:  Dimension 6:  Recovery/Iiving environment criteria description: uta  ASAM Severity Score:    ASAM Recommended Level of Treatment: ASAM Recommended Level of Treatment:  (n/a)   Substance use Disorder (SUD) Substance Use Disorder (SUD)  Checklist Symptoms of Substance Use:  (n/a)  Recommendations for Services/Supports/Treatments: Recommendations for Services/Supports/Treatments Recommendations For Services/Supports/Treatments: Individual Therapy, Inpatient Hospitalization, Medication Management  Disposition Recommendation per psychiatric provider:  Recommends geropsychiatry inpatient treatment.   DSM5 Diagnoses: Patient Active Problem List   Diagnosis Date Noted   Marijuana abuse    Syncope 03/06/2018   Acute lower UTI 03/06/2018   Hypokalemia 03/06/2018   Acute renal failure superimposed on stage 2 chronic kidney disease (HCC) 03/06/2018   Hypertensive urgency 03/06/2018   Hypertension    Acute cystitis without hematuria    Tachycardia      Referrals to Alternative Service(s): Referred to Alternative Service(s):   Place:   Date:   Time:    Referred to Alternative  Service(s):   Place:   Date:   Time:    Referred to Alternative Service(s):   Place:   Date:   Time:    Referred to Alternative Service(s):   Place:   Date:   Time:     Adelfa Adolph, [facility]

## 2024-05-04 NOTE — ED Provider Notes (Signed)
 [place] EMERGENCY DEPARTMENT AT [facility] Provider Note   CSN: [id number] Arrival date & time: 05/04/24  0005     History  Chief Complaint  Patient presents with   Altered Mental Status    Tracey Ramos is a 68 y.o. female.  Patient brought to the emergency department from home by ambulance.  Patient reportedly woke up from a nap and called 911.  She reportedly told dispatch that she thought she was having a stroke.  At arrival she cannot tell me what made her think she was having a stroke.  Patient lives with family who told EMS that she has a mental health history and has been behaving strangely for the last couple of weeks, has not been taking her medications.       Home Medications Prior to Admission medications   Medication Sig Start Date End Date Taking? Authorizing Provider  OLANZapine  (ZYPREXA ) 15 MG tablet Take 15 mg by mouth at bedtime.   Yes [provider]  amLODipine  (NORVASC ) 10 MG tablet Take 1 tablet (10 mg total) by mouth daily. Please schedule appt for further refills. 01/14/24   Marius Siemens, NP  rosuvastatin  (CRESTOR ) 40 MG tablet Take 1 tablet (40 mg total) by mouth daily. Please schedule appt for further refills. 01/14/24   Marius Siemens, NP      Allergies    Penicillins    Review of Systems   Review of Systems  Physical Exam Updated Vital Signs BP (!) 145/98 (BP Location: Right Arm)   Pulse 93   Temp 98.6 F (37 C) (Oral)   Resp 18   Ht 5\' 5"  (1.651 m)   Wt 81.2 kg   SpO2 100%   BMI 29.79 kg/m  Physical Exam Vitals and nursing note reviewed.  Constitutional:      General: She is not in acute distress.    Appearance: She is well-developed.  HENT:     Head: Normocephalic and atraumatic.     Mouth/Throat:     Mouth: Mucous membranes are moist.  Eyes:     General: Vision grossly intact. Gaze aligned appropriately.     Extraocular Movements: Extraocular movements intact.     Conjunctiva/sclera:  Conjunctivae normal.  Cardiovascular:     Rate and Rhythm: Normal rate and regular rhythm.     Pulses: Normal pulses.     Heart sounds: Normal heart sounds, S1 normal and S2 normal. No murmur heard.    No friction rub. No gallop.  Pulmonary:     Effort: Pulmonary effort is normal. No respiratory distress.     Breath sounds: Normal breath sounds.  Abdominal:     General: Bowel sounds are normal.     Palpations: Abdomen is soft.     Tenderness: There is no abdominal tenderness. There is no guarding or rebound.     Hernia: No hernia is present.  Musculoskeletal:        General: No swelling.     Cervical back: Full passive range of motion without pain, normal range of motion and neck supple. No spinous process tenderness or muscular tenderness. Normal range of motion.     Right lower leg: No edema.     Left lower leg: No edema.  Skin:    General: Skin is warm and dry.     Capillary Refill: Capillary refill takes less than 2 seconds.     Findings: No ecchymosis, erythema, rash or wound.  Neurological:     General: No  focal deficit present.     Mental Status: She is alert.     GCS: GCS eye subscore is 4. GCS verbal subscore is 5. GCS motor subscore is 6.     Cranial Nerves: Cranial nerves 2-12 are intact.     Sensory: Sensation is intact.     Motor: Motor function is intact.     Coordination: Coordination is intact.  Psychiatric:        Attention and Perception: Attention normal.        Mood and Affect: Mood normal. Affect is flat.        Speech: Speech is delayed.        Behavior: Behavior is slowed.     ED Results / Procedures / Treatments   Labs (all labs ordered are listed, but only abnormal results are displayed) Labs Reviewed  URINALYSIS, ROUTINE W REFLEX MICROSCOPIC - Abnormal; Notable for the following components:      Result Value   APPearance HAZY (*)    Leukocytes,Ua TRACE (*)    Bacteria, UA RARE (*)    All other components within normal limits  RAPID URINE DRUG  SCREEN, HOSP PERFORMED - Abnormal; Notable for the following components:   Tetrahydrocannabinol POSITIVE (*)    All other components within normal limits  COMPREHENSIVE METABOLIC PANEL WITH GFR - Abnormal; Notable for the following components:   Creatinine, Ser 1.10 (*)    GFR, Estimated 55 (*)    All other components within normal limits  CBC WITH DIFFERENTIAL/PLATELET  ETHANOL  AMMONIA    EKG None   ED ECG REPORT   Date: 05/04/2024  Rate: 90  Rhythm: normal sinus rhythm  QRS Axis: normal  Intervals: normal  ST/T Wave abnormalities: nonspecific ST changes  Conduction Disutrbances:none  Narrative Interpretation:   Old EKG Reviewed: unchanged  I have personally reviewed the EKG tracing and agree with the computerized printout as noted.   Radiology CT HEAD WO CONTRAST ( ) Result Date: 05/04/2024 CLINICAL DATA:  Altered mental status EXAM: CT HEAD WITHOUT CONTRAST TECHNIQUE: Contiguous axial images were obtained from the base of the skull through the vertex without intravenous contrast. RADIATION DOSE REDUCTION: This exam was performed according to the departmental dose-optimization program which includes automated exposure control, adjustment of the mA and/or kV according to patient size and/or use of iterative reconstruction technique. COMPARISON:  06/05/2023 FINDINGS: Brain: No evidence of acute infarction, hemorrhage, hydrocephalus, extra-axial collection or mass lesion/mass effect. Mild atrophic and chronic white matter ischemic changes are again seen. Vascular: No hyperdense vessel or unexpected calcification. Skull: Normal. Negative for fracture or focal lesion. Sinuses/Orbits: No acute finding. Other: None. IMPRESSION: Chronic atrophic and ischemic changes stable from the prior exam. Electronically Signed   By: Violeta Grey M.D.   On: 05/04/2024 01:13    Procedures Procedures    Medications Ordered in ED Medications - No data to display  ED Course/ Medical Decision  Making/ A&P                                 Medical Decision Making Amount and/or Complexity of Data Reviewed Labs: ordered. Radiology: ordered.   Differential diagnosis considered includes, but not limited to: TIA; Stroke; ICH; Seizure; electrolyte abnormality; hypoglycemia; toxic/pharmacologic causes; CNS infection; psychiatric disorder  Patient presents to the emergency department for altered mental status.  At arrival to the emergency department she is awake and alert but seems very slow to respond  to questions.  Affect is very flat, patient very withdrawn.  Her workup has been negative from a medical standpoint.  I discussed this with the family and they report that her mood has been off and she does have a history of mental health issues.  She did see her doctor today and got restarted on some medications.  We did agree that she could talk to TTS and see if they could add any more layers of help, as her alterations seem to be behavioral.        Final Clinical Impression(s) / ED Diagnoses Final diagnoses:  Altered mental status, unspecified altered mental status type    Rx / DC Orders ED Discharge Orders     None         Simon Aaberg, Marine Sia, MD 05/04/24 [phone]

## 2024-05-04 NOTE — ED Notes (Signed)
 Notified of patient being moved to ER 51 for completion of TTS evaluation with probable discharge post completion of TTS per charge RN Adriana Hopping. Patient not dressed out at this time.

## 2024-05-04 NOTE — ED Notes (Signed)
TTS completed at this time. 

## 2024-05-04 NOTE — ED Notes (Signed)
 Patient moved to ER 51 for TTS assessment. Assessment in process at this time.

## 2024-05-04 NOTE — Progress Notes (Signed)
 Pt was accepted to [facility] 05/04/2024  Bed Assignment L31  Address: [address] [place], [place], [place] [address]  [facility] Fax: [phone]  Pt meets inpatient criteria per: Bonnita Buttner NP  Attending Physician will be Dr. Romona Cobb  Report can be called to: [phone]  Pt can arrive after ASAP  Care Team notified: Bevin Bucks RN, Heyward Loud NP, Tita Form Morris RN     [place] Jeane Cashatt MSW, [facility] 05/04/2024 12:59 PM

## 2024-05-04 NOTE — ED Notes (Signed)
 [address] Transport, [phone], to cancel ride for this patient. Will call them back when facility is able to take report later in the night.

## 2024-05-04 NOTE — ED Notes (Addendum)
 Voluntary- call safe transport @ [address] PSYCH [address] [place], [place] [address]   RN accepting # [phone]

## 2024-05-04 NOTE — ED Notes (Signed)
Voluntary 

## 2024-05-04 NOTE — ED Notes (Signed)
Safe transport on the way

## 2024-05-04 NOTE — ED Provider Notes (Signed)
 Emergency Medicine Observation Re-evaluation Note  Tracey Ramos is a 68 y.o. female, seen on rounds today.  Pt initially presented to the ED for complaints of Altered Mental Status Currently, the patient is awake and alert.  Pt presented to the ED early this am with AMS.  She was medically cleared, but family wanted her assessed by TTS.    Physical Exam  BP 138/87   Pulse 93   Temp 97.9 F (36.6 C) (Oral)   Resp (!) 24   Ht 5\' 5"  (1.651 m)   Wt 81.2 kg   SpO2 100%   BMI 29.79 kg/m  Physical Exam General: awake and alert Cardiac: rr Lungs: clear Psych: calm  ED Course / MDM  EKG:   I have reviewed the labs performed to date as well as medications administered while in observation.  Recent changes in the last 24 hours include ED visit; medically cleared; TTS eval  Plan  Current plan is for inpatient geropsych placement.    Sueellen Emery, MD 05/04/24 [phone]

## 2024-05-04 NOTE — ED Notes (Signed)
 Attempted to call report to [facility], [phone]. Was told to call back.

## 2024-05-04 NOTE — ED Notes (Signed)
 Pt changed out, belongings placed in locker # 3, and wanded by security.

## 2024-05-04 NOTE — ED Notes (Addendum)
 Pt's grandson at bedside with pt. Visitor belongings placed behind nurses station for security reasons. Pt's phone and wallet will be taken by grandson.

## 2024-05-04 NOTE — ED Notes (Signed)
 I called [facility] [phone]) and had to leave a voice mail. My message included patient name, rm # at [facility] ED, where she was going and that address. I also left a message to call [phone] (purple zone rn).   [facility] PSYCH [address] [place], [place] [address]  RN accepting # [phone]

## 2024-05-05 ENCOUNTER — Encounter: Payer: Self-pay | Admitting: Psychiatry

## 2024-05-05 ENCOUNTER — Other Ambulatory Visit: Payer: Self-pay

## 2024-05-05 ENCOUNTER — Inpatient Hospital Stay
Admission: RE | Admit: 2024-05-05 | Discharge: 2024-05-11 | DRG: 885 | Disposition: A | Discharge status: Home / Self Care | Payer: Self-pay | Source: Intra-hospital | Attending: Psychiatry | Admitting: Psychiatry

## 2024-05-05 DIAGNOSIS — Z818 Family history of other mental and behavioral disorders: Secondary | ICD-10-CM

## 2024-05-05 DIAGNOSIS — F419 Anxiety disorder, unspecified: Secondary | ICD-10-CM | POA: Diagnosis present

## 2024-05-05 DIAGNOSIS — Z880 Allergy status to penicillin: Secondary | ICD-10-CM | POA: Diagnosis not present

## 2024-05-05 DIAGNOSIS — F22 Delusional disorders: Secondary | ICD-10-CM | POA: Diagnosis present

## 2024-05-05 DIAGNOSIS — G4700 Insomnia, unspecified: Secondary | ICD-10-CM | POA: Diagnosis present

## 2024-05-05 DIAGNOSIS — Z79899 Other long term (current) drug therapy: Secondary | ICD-10-CM | POA: Diagnosis not present

## 2024-05-05 DIAGNOSIS — Z8249 Family history of ischemic heart disease and other diseases of the circulatory system: Secondary | ICD-10-CM | POA: Diagnosis not present

## 2024-05-05 DIAGNOSIS — I129 Hypertensive chronic kidney disease with stage 1 through stage 4 chronic kidney disease, or unspecified chronic kidney disease: Secondary | ICD-10-CM | POA: Diagnosis present

## 2024-05-05 DIAGNOSIS — Z87891 Personal history of nicotine dependence: Secondary | ICD-10-CM | POA: Diagnosis not present

## 2024-05-05 DIAGNOSIS — F314 Bipolar disorder, current episode depressed, severe, without psychotic features: Secondary | ICD-10-CM | POA: Diagnosis present

## 2024-05-05 DIAGNOSIS — N182 Chronic kidney disease, stage 2 (mild): Secondary | ICD-10-CM | POA: Diagnosis present

## 2024-05-05 DIAGNOSIS — R4182 Altered mental status, unspecified: Secondary | ICD-10-CM | POA: Diagnosis not present

## 2024-05-05 DIAGNOSIS — F250 Schizoaffective disorder, bipolar type: Secondary | ICD-10-CM | POA: Diagnosis present

## 2024-05-05 MED ORDER — ACETAMINOPHEN 325 MG PO TABS: 650.0000 mg | ORAL_TABLET | Freq: Four times a day (QID) | ORAL | Status: DC | PRN

## 2024-05-05 MED ORDER — ROSUVASTATIN CALCIUM 10 MG PO TABS
40.0000 mg | ORAL_TABLET | Freq: Every day | ORAL | Status: DC
  Administered 2024-05-06 – 2024-05-11 (×6): 40 mg via ORAL
  Filled 2024-05-05 (×6): qty 4

## 2024-05-05 MED ORDER — MAGNESIUM HYDROXIDE 400 MG/5ML PO SUSP: 30.0000 mL | Freq: Every day | ORAL | Status: DC | PRN

## 2024-05-05 MED ORDER — OLANZAPINE 5 MG PO TBDP: 5.0000 mg | ORAL_TABLET | Freq: Three times a day (TID) | ORAL | Status: DC | PRN

## 2024-05-05 MED ORDER — OLANZAPINE 10 MG IM SOLR: 5.0000 mg | Freq: Three times a day (TID) | INTRAMUSCULAR | Status: DC | PRN

## 2024-05-05 MED ORDER — OLANZAPINE 5 MG PO TABS
15.0000 mg | ORAL_TABLET | Freq: Every day | ORAL | Status: DC
  Administered 2024-05-05 – 2024-05-10 (×6): 15 mg via ORAL
  Filled 2024-05-05 (×6): qty 3

## 2024-05-05 MED ORDER — AMLODIPINE BESYLATE 5 MG PO TABS
10.0000 mg | ORAL_TABLET | Freq: Every day | ORAL | Status: DC
  Administered 2024-05-06 – 2024-05-11 (×6): 10 mg via ORAL
  Filled 2024-05-05 (×7): qty 2

## 2024-05-05 MED ORDER — ALUM & MAG HYDROXIDE-SIMETH 200-200-20 MG/5ML PO SUSP: 30.0000 mL | ORAL | Status: DC | PRN

## 2024-05-05 NOTE — ED Provider Notes (Signed)
 Emergency Medicine Observation Re-evaluation Note  Tanashia Trivedi is a 68 y.o. female, seen on rounds today.  Pt initially presented to the ED for complaints of Altered Mental Status Currently, the patient is resting.  Physical Exam  BP (!) 149/89 (BP Location: Left Arm)   Pulse 99   Temp 98.4 F (36.9 C) (Oral)   Resp 18   Ht 5\' 5"  (1.651 m)   Wt 81.2 kg   SpO2 97%   BMI 29.79 kg/m  Physical Exam General: NAD   ED Course / MDM  EKG:   I have reviewed the labs performed to date as well as medications administered while in observation.  Recent changes in the last 24 hours include no acute events reported.  Plan  Current plan is for placement [facility] Geropsych/BH unit, Dr Jadapalle - Maybe today.    Burnette Carte, MD 05/05/24 [phone]

## 2024-05-05 NOTE — ED Notes (Signed)
 Informed by Cheree Cords, RN that the patient's transfer to the unit will be delayed due to acuity and staffing of that unit and that I will be informed by [place], RN when I can call and give report to send the patient over.

## 2024-05-05 NOTE — Group Note (Signed)
 Physical/Occupational Therapy Group Note  Group Topic: Neurographic Art  Group Date: 05/05/2024 Start Time: 1300 End Time: 1400 Facilitators: Hilma Lucks, OT   Group Description: Group participated with Neurographic art activity, using watercolor paints to facilitate creative expression and meditation/relaxation for each individual.  Incorporated bimanual coordination, mental focus, emotional processing, task/command following and relaxation techniques as appropriate.  Patients engaged socially with therapist and other group participants throughout session. Allowed to ask questions as appropriate, and encouraged to identify ways they could use/share their creations with themselves and others.  Therapeutic Goal(s):  Demonstrate ability to independently manipulate utensils required to participate with and complete activity. Demonstrate ability to cognitively focus on task and follow commands necessary for completion. Demonstrate use of art as an outlet for emotional processing and expression. Identify and demonstrate importance of relaxation, neural calming and meditation for improved participation with life groups.  Individual Participation:  Participation Level: Did not attend   Participation Quality:   Behavior:   Speech/Thought Process:   Affect/Mood:   Insight:   Judgement:   Modes of Intervention:   Patient Response to Interventions:    Plan: Continue to engage patient in PT/OT groups 1 - 2x/week.  Talayah Picardi R., MPH, MS, OTR/L ascom [phone] 05/05/24, 3:46 PM

## 2024-05-05 NOTE — ED Notes (Signed)
 Update: staff still unsure if they will have the availability to accept transfer of this pt to [facility] today. Staff to update primary RN once they are able. [facility] consent form faxed to facility by this RN.

## 2024-05-05 NOTE — Tx Team (Signed)
 Initial Treatment Plan 05/05/2024 2:38 PM [name] ZOX:[id number]    PATIENT STRESSORS: Health problems   Medication change or noncompliance     PATIENT STRENGTHS: Ability for insight  Capable of independent living  Religious Affiliation  Supportive family/friends    PATIENT IDENTIFIED PROBLEMS:   "I have mental health issues and I need help."                   DISCHARGE CRITERIA:  Ability to meet basic life and health needs Adequate post-discharge living arrangements Improved stabilization in mood, thinking, and/or behavior Safe-care adequate arrangements made  PRELIMINARY DISCHARGE PLAN: Attend aftercare/continuing care group Return to previous work or school arrangements  PATIENT/FAMILY INVOLVEMENT: This treatment plan has been presented to and reviewed with the patient, Tracey Ramos. The patient has been given the opportunity to ask questions and make suggestions.  Lestine Rathke, RN 05/05/2024, 2:38 PM

## 2024-05-05 NOTE — Plan of Care (Signed)
   Problem: Education: Goal: Knowledge of [facility] Education information/materials will improve Outcome: Not Progressing Goal: Emotional status will improve Outcome: Not Progressing Goal: Mental status will improve Outcome: Not Progressing Goal: Verbalization of understanding the information provided will improve Outcome: Not Progressing

## 2024-05-05 NOTE — ED Notes (Signed)
 Spoke with Lenon Radar from [employer]. Transportation arranged, unknown ETA.

## 2024-05-05 NOTE — Group Note (Signed)
 Date:  05/05/2024 Time:  9:01 PM  Group Topic/Focus:  Wrap-Up Group:   The focus of this group is to help patients review their daily goal of treatment and discuss progress on daily workbooks.    Participation Level:  Did Not Attend  Bradley Caffey 05/05/2024, 9:01 PM

## 2024-05-05 NOTE — ED Notes (Signed)
 Spoke with [place] at [facility], [phone], to see when would be good time to call report. She states around 9 am. Will pass along that message to dayshift RN to call to give report at 9 am so we can tranfer the patient over.

## 2024-05-05 NOTE — Progress Notes (Signed)
 Patient is a 69 year old female admitted voluntarily to the [facility] floor from [facility] ED for altered mental status and complaints of paranoia. Patient has a history of bipolar disorder. Patient presents to assessment ambulatory. She is A+O x 4. She currently denies SI/HI/AVH. Patient's affect is sad and speech is logical and coherent. Patient endorses depression but denies anxiety.  She currently denies pain.  Patient denies the use of a mobility aid at home. Reports the use of reading glasses but has left them at home. Reports last BM was two days ago. Patient denies smoking cigs and drinking alcohol. Endorses marijuana use. Patient says that her support system is her granddaughter. Her goal while she is here is "to talk more and say what is on her mind."   Skin assessment and body search completed with Latrice, MHT. Skin: warm/dry. L inner forearm bruise. 3 tattoos. No contrabands found.   Emotional support and reassurance provided throughout admission intake. Consents signed. Afterwards, oriented patient to unit, room and call light, reviewed POC with all questions answered and concerns voiced. Patient verbalized understanding. Denies any needs at this time. Will continue to monitor with ongoing Q 15 minute safety checks.

## 2024-05-05 NOTE — Progress Notes (Signed)
   05/05/24 1500  Psych Admission Type (Psych Patients Only)  Admission Status Voluntary  Psychosocial Assessment  Patient Complaints Depression  Eye Contact Fair  Facial Expression Flat  Affect Sad  Speech Logical/coherent  Interaction Assertive  Motor Activity Other (Comment) (WNL)  Appearance/Hygiene In scrubs  Behavior Characteristics Cooperative  Mood Depressed  Thought Process  Coherency WDL  Content WDL  Delusions None reported or observed  Perception WDL  Hallucination None reported or observed  Judgment WDL  Confusion WDL  Danger to Self  Current suicidal ideation? Denies  Danger to Others  Danger to Others None reported or observed

## 2024-05-06 LAB — LIPID PANEL
Cholesterol: 133 mg/dL (ref 0–200)
HDL: 54 mg/dL (ref >40)
LDL Cholesterol: 66 mg/dL (ref 0–99)
Total CHOL/HDL Ratio: 2.5 ratio
Triglycerides: 64 mg/dL (ref <150)
VLDL: 13 mg/dL (ref 0–40)

## 2024-05-06 LAB — HEMOGLOBIN A1C
Hgb A1c MFr Bld: 4.7 % — ABNORMAL LOW (ref 4.8–5.6)
Mean Plasma Glucose: 88.19 mg/dL

## 2024-05-06 NOTE — [facility] Suicide Risk Assessment (Signed)
 [facility] Admission Suicide Risk Assessment   Nursing information obtained from:  Patient Demographic factors:  Age 68 or older, Living alone Current Mental Status:  NA Loss Factors:  NA Historical Factors:  NA Risk Reduction Factors:  NA  Total Time spent with patient: 30 minutes Principal Problem: Bipolar 1 disorder, depressed, severe (HCC) Diagnosis:  Principal Problem:   Bipolar 1 disorder, depressed, severe (HCC)  Subjective Data: Tracey Ramos is a 68 year old female presenting voluntary to [facility] due altered mental status. Per EMS, "EMS was called because patient woke up from a nap and stated to call 911 that she is having a stroke. Per family, patient has been acting odd for the past few weeks. Patient has mental health history and has not been complaints with meds. Patient is AO X 4. Patient has flat affect".  Patient is admitted to [facility] unit with Q15 min safety monitoring. Multidisciplinary team approach is offered. Medication management; group/milieu therapy is offered.   Continued Clinical Symptoms:  Alcohol Use Disorder Identification Test Final Score (AUDIT): 0 The "Alcohol Use Disorders Identification Test", Guidelines for Use in Primary Care, Second Edition.  World Science writer [facility]). Score between 0-7:  no or low risk or alcohol related problems. Score between 8-15:  moderate risk of alcohol related problems. Score between 16-19:  high risk of alcohol related problems. Score 20 or above:  warrants further diagnostic evaluation for alcohol dependence and treatment.   CLINICAL FACTORS:   Schizophrenia:   Paranoid or undifferentiated type   Musculoskeletal: Strength & Muscle Tone: within normal limits Gait & Station: normal Patient leans: N/A  Psychiatric Specialty Exam:  Presentation  General Appearance:  Casual  Eye Contact: Minimal  Speech: Slow  Speech Volume: Decreased  Handedness: Right   Mood and Affect   Mood: Dysphoric  Affect: Depressed; Flat   Thought Process  Thought Processes: Irrevelant (thought blocking)  Descriptions of Associations:Intact  Orientation:Partial  Thought Content:Scattered; Illogical  History of Schizophrenia/Schizoaffective disorder:No  Duration of Psychotic Symptoms:No data recorded Hallucinations:Hallucinations: None  Ideas of Reference:Paranoia  Suicidal Thoughts:Suicidal Thoughts: No  Homicidal Thoughts:Homicidal Thoughts: No   Sensorium  Memory: Immediate Fair; Recent Poor; Remote Poor  Judgment: Impaired  Insight: Shallow   Executive Functions  Concentration: Poor  Attention Span: Poor  Recall: Poor  Fund of Knowledge: Fair  Language: Fair   Psychomotor Activity  Psychomotor Activity: Psychomotor Activity: Normal   Assets  Assets: Desire for Improvement; Resilience; Social Support   Sleep  Sleep: Sleep: Fair    Physical Exam: Physical Exam ROS Blood pressure 119/80, pulse 99, temperature 98.4 F (36.9 C), resp. rate 15, height 5\' 5"  (1.651 m), weight 91.2 kg, SpO2 100%. Body mass index is 33.45 kg/m.   COGNITIVE FEATURES THAT CONTRIBUTE TO RISK:  None    SUICIDE RISK:   Minimal: No identifiable suicidal ideation.  Patients presenting with no risk factors but with morbid ruminations; may be classified as minimal risk based on the severity of the depressive symptoms  PLAN OF CARE: Patient is admitted to [facility] psych unit with Q15 min safety monitoring. Multidisciplinary team approach is offered. Medication management; group/milieu therapy is offered.   I certify that inpatient services furnished can reasonably be expected to improve the patient's condition.   Aurelia Blotter, MD 05/06/2024, 12:53 PM

## 2024-05-06 NOTE — Group Note (Signed)
 Date:  05/06/2024 Time:  9:15 PM  Group Topic/Focus:  Self Care:   The focus of this group is to help patients understand the importance of self-care in order to improve or restore emotional, physical, spiritual, interpersonal, and financial health.    Participation Level:  Minimal  Participation Quality:  Appropriate  Affect:  Appropriate and Flat  Cognitive:  Appropriate  Insight: Appropriate  Engagement in Group:  Engaged and Lacking  Modes of Intervention:  Discussion, Education, and Support  Additional Comments:  n/a  Alfa Leibensperger L 05/06/2024, 9:15 PM

## 2024-05-06 NOTE — Plan of Care (Signed)
  Problem: Health Behavior/Discharge Planning: Goal: Compliance with treatment plan for underlying cause of condition will improve Outcome: Progressing   Problem: Coping: Goal: Coping ability will improve Outcome: Progressing

## 2024-05-06 NOTE — Progress Notes (Signed)
 Patient admitted to [facility] on May 05, 2024 for altered mental status and paranoia.  Patient denies SI/HI/AVH. She endorses depression. Support and encouragement offered. Patient was visible in the milieu and will minimally interact with peers. No PRN's administered. Patient takes meds whole without difficulty.   Q15 minute unit checks in place.

## 2024-05-06 NOTE — Progress Notes (Signed)
   05/05/24 2200  Psych Admission Type (Psych Patients Only)  Admission Status Voluntary  Psychosocial Assessment  Patient Complaints Depression  Eye Contact Brief  Facial Expression Flat  Affect Sad  Speech Logical/coherent  Interaction Assertive  Motor Activity Slow  Appearance/Hygiene In scrubs  Behavior Characteristics Cooperative  Mood Depressed  Thought Process  Coherency WDL  Content WDL  Delusions None reported or observed  Perception WDL  Hallucination None reported or observed  Confusion WDL  Danger to Self  Current suicidal ideation? Denies  Danger to Others  Danger to Others None reported or observed

## 2024-05-06 NOTE — Plan of Care (Signed)
   Problem: Coping: Goal: Coping ability will improve Outcome: Progressing

## 2024-05-06 NOTE — H&P (Signed)
 Psychiatric Admission Assessment Adult  Patient Identification: Tracey Ramos MRN:  [id number] Date of Evaluation:  05/06/2024 Chief Complaint:  Bipolar 1 disorder, depressed, severe (HCC) [F31.4]  Per ED assessment :Tracey Ramos is a 68 year old female presenting voluntary to [facility] due altered mental status. Per EMS, "EMS was called because patient woke up from a nap and stated to call 911 that she is having a stroke. Per family, patient has been acting odd for the past few weeks. Patient has mental health history and has not been complaints with meds. Patient is AO X 4. Patient has flat affect".During assessment patient was flat and staring at clinician. At times patient did not respond to questions. Patient reports feeling paranoid, unable to share timeframe of onset. Patient reports using marijuana, amount and timeframe unknown. Patient reports receiving bipolar medications from PCP at [facility] and that she has an upcoming appointment, unable to recall date and time. Patient reports she has been out of her psych medications and needs more. Patient unable to share last time she was off her medications. Patient reports getting little sleep    History of Present Illness: Patient is a 68 year old female with history of schizoaffective disorder bipolar type admitted for change in mental health status, displaying odd behaviors, worsening insomnia, endorsing paranoia, in the context of running out of her medication.  Patient is currently admitted to general psychiatric unit for further stabilization  Total Time spent with patient: 1 hour Sleep  Sleep:Sleep: Fair  Past Psychiatric History:  Psychiatric History:  Information collected from .  Patient  Prev Dx/Sx: bipolar disorder versus schizoaffective disorder Current Psych Provider: Dr. 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  Of time Prescription drug abuse: Denies Rehab hx: Denies Is the patient at risk to self? No.  Has the patient been a risk to self in the past 6 months? No.  Has the patient been a risk to self within the distant past? No.  Is the patient a risk to others? No.  Has the patient been a risk to others in the past 6 months? No.  Has the patient been a risk to others within the distant past? No.   [place] Scale:  Flowsheet Row Admission (Current) from 05/05/2024 in [facility] ED from 05/04/2024 in [facility] Emergency Department at [facility] from 05/03/2024 in [facility] at [facility])  C-SSRS RISK CATEGORY No Risk No Risk No Risk        Past Medical History:  Past Medical History:  Diagnosis Date   Cholelithiasis    CKD (chronic kidney disease), stage II    Hernia of abdominal wall    Hypertension    Marijuana abuse     Past Surgical History:  Procedure Laterality Date   APPENDECTOMY     Family History:  Family History  Problem Relation Age of Onset   Depression  Mother    Hypertension Father    Heart attack Father     Social History:  Social History   Substance and Sexual Activity  Alcohol Use Yes     Social History   Substance and Sexual Activity  Drug Use Yes   Types: Marijuana      Allergies:   Allergies  Allergen Reactions   Penicillins  Swelling   Lab Results:  Results for orders placed or performed during the hospital encounter of 05/05/24 (from the past 48 hours)  Lipid panel     Status: None   Collection Time: 05/06/24  6:08 AM  Result Value Ref Range   Cholesterol 133 0 - 200 mg/dL   Triglycerides 64 <161 mg/dL   HDL 54 >09 mg/dL   Total CHOL/HDL Ratio 2.5 RATIO   VLDL 13 0 - 40 mg/dL   LDL Cholesterol 66 0 - 99 mg/dL    Comment:        Total Cholesterol/HDL:CHD Risk Coronary Heart Disease Risk Table                     Men   Women  1/2 Average Risk   3.4   3.3  Average Risk       5.0   4.4  2 X Average Risk   9.6   7.1  3 X Average Risk  23.4   11.0        Use the calculated Patient Ratio above and the CHD Risk Table to determine the patient's CHD Risk.        ATP III CLASSIFICATION (LDL):  <100     mg/dL   Optimal  604-540  mg/dL   Near or Above                    Optimal  130-159  mg/dL   Borderline  981-191  mg/dL   High  >478     mg/dL   Very High Performed at [facility], [address] [place], [place] [address]   Hemoglobin A1c     Status: Abnormal   Collection Time: 05/06/24  6:08 AM  Result Value Ref Range   Hgb A1c MFr Bld 4.7 (L) 4.8 - 5.6 %    Comment: (NOTE) Pre diabetes:          5.7%-6.4%  Diabetes:              >6.4%  Glycemic control for   <7.0% adults with diabetes    Mean Plasma Glucose 88.19 mg/dL    Comment: Performed at [facility], [address], [place], [place] [address]    Blood Alcohol level:  Lab Results  Component Value Date   [facility] <15 05/04/2024   ETH <10 08/06/2022    Metabolic Disorder Labs:  Lab Results  Component Value Date   HGBA1C 4.7 (L) 05/06/2024   MPG 88.19 05/06/2024   No results found for: "PROLACTIN" Lab Results  Component Value Date   CHOL 133 05/06/2024   TRIG 64 05/06/2024   HDL 54 05/06/2024   CHOLHDL 2.5 05/06/2024   VLDL 13 05/06/2024   LDLCALC 66 05/06/2024   LDLCALC 149 (H) 02/11/2023    Current  Medications: Current Facility-Administered Medications  Medication Dose Route Frequency Provider Last Rate Last Admin   acetaminophen  (TYLENOL ) tablet 650 mg  650 mg Oral Q6H PRN Volanda Gruber, NP       alum & mag hydroxide-simeth (MAALOX/MYLANTA)  200-200-20 MG/5ML suspension 30 mL  30 mL Oral Q4H PRN Volanda Gruber, NP       amLODipine  (NORVASC ) tablet 10 mg  10 mg Oral Daily Volanda Gruber, NP   10 mg at 05/06/24 0938   magnesium hydroxide (MILK OF MAGNESIA) suspension 30 mL  30 mL Oral Daily PRN Volanda Gruber, NP       OLANZapine  (ZYPREXA ) injection 5 mg  5 mg Intramuscular TID PRN Volanda Gruber, NP       OLANZapine  (ZYPREXA ) tablet 15 mg  15 mg Oral QHS Volanda Gruber, NP   15 mg at 05/05/24 2137   OLANZapine  zydis (ZYPREXA ) disintegrating tablet 5 mg  5 mg Oral TID PRN Volanda Gruber, NP       rosuvastatin  (CRESTOR ) tablet 40 mg  40 mg Oral Daily Volanda Gruber, NP   40 mg at 05/06/24 1610   PTA Medications: Medications Prior to Admission  Medication Sig Dispense Refill Last Dose/Taking   amLODipine  (NORVASC ) 10 MG tablet Take 1 tablet (10 mg total) by mouth daily. Please schedule appt for further refills. (Patient not taking: Reported on 05/04/2024) 30 tablet 0    OLANZapine  (ZYPREXA ) 15 MG tablet Take 15 mg by mouth at bedtime.      rosuvastatin  (CRESTOR ) 40 MG tablet Take 1 tablet (40 mg total) by mouth daily. Please schedule appt for further refills. (Patient not taking: Reported on 05/04/2024) 30 tablet 0     Psychiatric Specialty Exam:  Presentation  General Appearance:  Casual  Eye Contact: Minimal  Speech: Slow  Speech Volume: Decreased    Mood and Affect  Mood: Dysphoric  Affect: Depressed; Flat   Thought Process  Thought Processes: Irrevelant (thought blocking)  Descriptions of Associations:Intact  Orientation:Partial  Thought Content:Scattered; Illogical  Hallucinations:Hallucinations: None  Ideas of  Reference:Paranoia  Suicidal Thoughts:Suicidal Thoughts: No  Homicidal Thoughts:Homicidal Thoughts: No   Sensorium  Memory: Immediate Fair; Recent Poor; Remote Poor  Judgment: Impaired  Insight: Shallow   Executive Functions  Concentration: Poor  Attention Span: Poor  Recall: Poor  Fund of Knowledge: Fair  Language: Fair   Psychomotor Activity  Psychomotor Activity: Psychomotor Activity: Normal   Assets  Assets: Desire for Improvement; Resilience; Social Support    Musculoskeletal: Strength & Muscle Tone: within normal limits Gait & Station: normal  Physical Exam: Physical Exam Vitals and nursing note reviewed.  HENT:     Head: Normocephalic.     Nose: Nose normal.     Mouth/Throat:     Mouth: Mucous membranes are moist.  Cardiovascular:     Pulses: Normal pulses.  Pulmonary:     Effort: Pulmonary effort is normal.  Abdominal:     General: Bowel sounds are normal.  Skin:    General: Skin is warm.  Neurological:     General: No focal deficit present.     Mental Status: She is alert.    Review of Systems  Constitutional: Negative.   HENT: Negative.    Eyes: Negative.   Respiratory: Negative.    Cardiovascular: Negative.   Skin: Negative.    Blood pressure 119/80, pulse 99, temperature 98.4 F (36.9 C), resp. rate 15, height 5\' 5"  (1.651 m), weight 91.2 kg, SpO2 100%. Body mass index is 33.45 kg/m.  Principal Diagnosis: Bipolar 1 disorder, depressed, severe (HCC) Diagnosis:  Principal Problem:   Bipolar 1 disorder, depressed, severe (HCC)   Clinical Decision Making: Possible history of schizoaffective disorder presented to ED with worsening  insomnia, paranoia, thought blocking in the context of running out of her medication.  Patient is currently admitted to inpatient psychiatric facility for stabilization.  Treatment Plan Summary:  Safety and Monitoring:             -- Voluntary admission to inpatient psychiatric unit for  safety, stabilization and treatment             -- Daily contact with patient to assess and evaluate symptoms and progress in treatment             -- Patient's case to be discussed in multi-disciplinary team meeting             -- Observation Level: q15 minute checks             -- Vital signs:  q12 hours             -- Precautions: suicide, elopement, and assault   2. Psychiatric Diagnoses and Treatment:               Will continue home medications Zyprexa  15 mg nightly as patient reports good response to it.   -- The risks/benefits/side-effects/alternatives to this medication were discussed in detail with the patient and time was given for questions. The patient consents to medication trial.                -- Metabolic profile and EKG monitoring obtained while on an atypical antipsychotic (BMI: Lipid Panel: HbgA1c: QTc:)              -- Encouraged patient to participate in unit milieu and in scheduled group therapies                            3. Medical Issues Being Addressed:      4. Discharge Planning:              -- Social work and case management to assist with discharge planning and identification of hospital follow-up needs prior to discharge             -- Estimated LOS: 5-7 days             -- Discharge Concerns: Need to establish a safety plan; Medication compliance and effectiveness             -- Discharge Goals: Return home with outpatient referrals follow ups  Physician Treatment Plan for Primary Diagnosis: Bipolar 1 disorder, depressed, severe (HCC) Long Term Goal(s): Improvement in symptoms so as ready for discharge  Short Term Goals: Ability to identify changes in lifestyle to reduce recurrence of condition will improve, Ability to verbalize feelings will improve, Ability to disclose and discuss suicidal ideas, and Ability to demonstrate self-control will improve  Physician Treatment Plan for Secondary Diagnosis: Principal Problem:   Bipolar 1 disorder, depressed,  severe (HCC)  Long Term Goal(s): Improvement in symptoms so as ready for discharge  Short Term Goals: Ability to identify changes in lifestyle to reduce recurrence of condition will improve, Ability to verbalize feelings will improve, Ability to disclose and discuss suicidal ideas, Ability to demonstrate self-control will improve, Ability to identify and develop effective coping behaviors will improve, Ability to maintain clinical measurements within normal limits will improve, Compliance with prescribed medications will improve, and Ability to identify triggers associated with substance abuse/mental health issues will improve  I certify that inpatient services furnished can reasonably be expected to improve the patient's condition.  Tresia Revolorio, MD 5/10/202512:55 PM

## 2024-05-07 DIAGNOSIS — F314 Bipolar disorder, current episode depressed, severe, without psychotic features: Secondary | ICD-10-CM

## 2024-05-07 NOTE — Group Note (Signed)
 Date:  05/07/2024 Time:  8:19 PM  Group Topic/Focus:  Orientation:   The focus of this group is to educate the patient on the purpose and policies of crisis stabilization and provide a format to answer questions about their admission.  The group details unit policies and expectations of patients while admitted.    Participation Level:  Active  Participation Quality:  Appropriate  Affect:  Flat  Cognitive:  Alert  Insight: Lacking  Engagement in Group:  Limited  Modes of Intervention:  Orientation  Additional Comments:    Sherlie Distance 05/07/2024, 8:19 PM

## 2024-05-07 NOTE — Group Note (Signed)
 Date:  05/07/2024 Time:  12:49 PM  Group Topic/Focus:  Personal Choices and Values:   The focus of this group is to help patients assess and explore the importance of values in their lives, how their values affect their decisions, how they express their values and what opposes their expression.    Participation Level:  Active  Participation Quality:  Appropriate  Affect:  Appropriate  Cognitive:  Appropriate  Insight: Appropriate  Engagement in Group:  Engaged  Modes of Intervention:  Activity  Additional Comments:    Ashlee Player 05/07/2024, 12:49 PM

## 2024-05-07 NOTE — [facility] Suicide Risk Assessment (Signed)
 [facility] INPATIENT:  Family/Significant Other Suicide Prevention Education  Suicide Prevention Education:  Contact Attempts: Norman Beckmann, daughter, [phone] , (name of family member/significant other) has been identified by the patient as the family member/significant other with whom the patient will be residing, and identified as the person(s) who will aid the patient in the event of a mental health crisis.  With written consent from the patient, two attempts were made to provide suicide prevention education, prior to and/or following the patient's discharge.  We were unsuccessful in providing suicide prevention education.  A suicide education pamphlet was given to the patient to share with family/significant other.  Date and time of first attempt: 05/07/2024 at 11:00 am Date and time of second attempt: Second attempt needed.  Santina Cull 05/07/2024, 11:03 AM

## 2024-05-07 NOTE — Progress Notes (Signed)
 [facility] MD Progress Note  05/07/2024 6:57 PM Tracey Ramos  MRN:  [id number]  Patient is a 68 year old female with history of schizoaffective disorder bipolar type admitted for change in mental health status, displaying odd behaviors, worsening insomnia, endorsing paranoia, in the context of running out of her medication. Patient is currently admitted to general psychiatric unit for further stabilization.  Subjective:  Chart reviewed, case discussed in multidisciplinary meeting, patient seen during rounds.  Patient is noted to be sitting in the day area.  Per nursing report patient remains quiet but is visible on the unit.  She is not responding to internal stimuli.  Patient displays thought blocking intermittently.  She reports feeling anxious but is unable to identify any triggers.  Provider and patient discussed about her daughter not picking up the phone.  Patient would be very restless.  She has/plan.  Patient is taking Zyprexa  and denies having any side effects.   Sleep: Negative  Appetite:  Negative  Past Psychiatric History: see h&P Family History:  Family History  Problem Relation Age of Onset   Depression Mother    Hypertension Father    Heart attack Father    Social History:  Social History   Substance and Sexual Activity  Alcohol Use Yes     Social History   Substance and Sexual Activity  Drug Use Yes   Types: Marijuana    Social History   Socioeconomic History   Marital status: Single    Spouse name: Not on file   Number of children: Not on file   Years of education: Not on file   Highest education level: Not on file  Occupational History   Not on file  Tobacco Use   Smoking status: Former    Types: Cigarettes   Smokeless tobacco: Never  Vaping Use   Vaping status: Never Used  Substance and Sexual Activity   Alcohol use: Yes   Drug use: Yes    Types: Marijuana   Sexual activity: Not Currently  Other Topics Concern   Not on file  Social History  Narrative   Not on file   Social Drivers of Health   Financial Resource Strain: Not on file  Food Insecurity: No Food Insecurity (05/05/2024)   Hunger Vital Sign    Worried About Running Out of Food in the Last Year: Never true    Ran Out of Food in the Last Year: Never true  Transportation Needs: No Transportation Needs (05/05/2024)   PRAPARE - Administrator, Civil Service (Medical): No    Lack of Transportation (Non-Medical): No  Physical Activity: Not on file  Stress: Not on file  Social Connections: Moderately Integrated (05/05/2024)   Social Connection and Isolation Panel [NHANES]    Frequency of Communication with Friends and Family: Twice a week    Frequency of Social Gatherings with Friends and Family: Three times a week    Attends Religious Services: 1 to 4 times per year    Active Member of Clubs or Organizations: Yes    Attends Banker Meetings: 1 to 4 times per year    Marital Status: Never married   Past Medical History:  Past Medical History:  Diagnosis Date   Cholelithiasis    CKD (chronic kidney disease), stage II    Hernia of abdominal wall    Hypertension    Marijuana abuse     Past Surgical History:  Procedure Laterality Date   APPENDECTOMY      Current  Medications: Current Facility-Administered Medications  Medication Dose Route Frequency Provider Last Rate Last Admin   acetaminophen  (TYLENOL ) tablet 650 mg  650 mg Oral Q6H PRN Volanda Gruber, NP       alum & mag hydroxide-simeth (MAALOX/MYLANTA) 200-200-20 MG/5ML suspension 30 mL  30 mL Oral Q4H PRN Volanda Gruber, NP       amLODipine  (NORVASC ) tablet 10 mg  10 mg Oral Daily Volanda Gruber, NP   10 mg at 05/07/24 0914   magnesium hydroxide (MILK OF MAGNESIA) suspension 30 mL  30 mL Oral Daily PRN Volanda Gruber, NP       OLANZapine  (ZYPREXA ) injection 5 mg  5 mg Intramuscular TID PRN Volanda Gruber, NP       OLANZapine  (ZYPREXA ) tablet 15 mg  15 mg Oral QHS Volanda Gruber, NP   15 mg at 05/06/24 2130   OLANZapine  zydis (ZYPREXA ) disintegrating tablet 5 mg  5 mg Oral TID PRN Volanda Gruber, NP       rosuvastatin  (CRESTOR ) tablet 40 mg  40 mg Oral Daily Volanda Gruber, NP   40 mg at 05/07/24 4098    Lab Results:  Results for orders placed or performed during the hospital encounter of 05/05/24 (from the past 48 hours)  Lipid panel     Status: None   Collection Time: 05/06/24  6:08 AM  Result Value Ref Range   Cholesterol 133 0 - 200 mg/dL   Triglycerides 64 <119 mg/dL   HDL 54 >14 mg/dL   Total CHOL/HDL Ratio 2.5 RATIO   VLDL 13 0 - 40 mg/dL   LDL Cholesterol 66 0 - 99 mg/dL    Comment:        Total Cholesterol/HDL:CHD Risk Coronary Heart Disease Risk Table                     Men   Women  1/2 Average Risk   3.4   3.3  Average Risk       5.0   4.4  2 X Average Risk   9.6   7.1  3 X Average Risk  23.4   11.0        Use the calculated Patient Ratio above and the CHD Risk Table to determine the patient's CHD Risk.        ATP III CLASSIFICATION (LDL):  <100     mg/dL   Optimal  782-956  mg/dL   Near or Above                    Optimal  130-159  mg/dL   Borderline  213-086  mg/dL   High  >578     mg/dL   Very High Performed at [facility], [address] [place], [place] [address]   Hemoglobin A1c     Status: Abnormal   Collection Time: 05/06/24  6:08 AM  Result Value Ref Range   Hgb A1c MFr Bld 4.7 (L) 4.8 - 5.6 %    Comment: (NOTE) Pre diabetes:          5.7%-6.4%  Diabetes:              >6.4%  Glycemic control for   <7.0% adults with diabetes    Mean Plasma Glucose 88.19 mg/dL    Comment: Performed at [facility], [address], [place], [place] [address]    Blood Alcohol level:  Lab Results  Component  Value Date   [facility] <15 05/04/2024   ETH <10 08/06/2022    Metabolic Disorder Labs: Lab Results  Component Value Date   HGBA1C 4.7 (L) 05/06/2024   MPG 88.19 05/06/2024   No results found for:  "PROLACTIN" Lab Results  Component Value Date   CHOL 133 05/06/2024   TRIG 64 05/06/2024   HDL 54 05/06/2024   CHOLHDL 2.5 05/06/2024   VLDL 13 05/06/2024   LDLCALC 66 05/06/2024   LDLCALC 149 (H) 02/11/2023    Physical Findings: AIMS:  , ,  ,  ,    CIWA:    COWS:      Psychiatric Specialty Exam:  Presentation  General Appearance:  Casual  Eye Contact: Minimal  Speech: Slow  Speech Volume: Decreased    Mood and Affect  Mood: Dysphoric  Affect: Depressed; Flat   Thought Process  Thought Processes: Irrevelant (thought blocking)  Descriptions of Associations:Intact  Orientation:Partial  Thought Content:Scattered; Illogical  Hallucinations:Hallucinations: None  Ideas of Reference:Paranoia  Suicidal Thoughts:Suicidal Thoughts: No  Homicidal Thoughts:Homicidal Thoughts: No   Sensorium  Memory: Immediate Fair; Recent Poor; Remote Poor  Judgment: Impaired  Insight: Shallow   Executive Functions  Concentration: Poor  Attention Span: Poor  Recall: Poor  Fund of Knowledge: Fair  Language: Fair   Psychomotor Activity  Psychomotor Activity: Psychomotor Activity: Normal  Musculoskeletal: Strength & Muscle Tone: within normal limits Gait & Station: normal Assets  Assets: Desire for Improvement; Resilience; Social Support    Physical Exam: Physical Exam Vitals and nursing note reviewed.    Review of Systems  Constitutional: Negative.   HENT: Negative.    Eyes: Negative.   Cardiovascular: Negative.   Gastrointestinal: Negative.   Skin: Negative.    Blood pressure 119/80, pulse 99, temperature 98.4 F (36.9 C), resp. rate 15, height 5\' 5"  (1.651 m), weight 91.2 kg, SpO2 100%. Body mass index is 33.45 kg/m.  Diagnosis: Principal Problem:   Bipolar 1 disorder, depressed, severe (HCC)  Clinical Decision Making: Possible history of schizoaffective disorder presented to ED with worsening insomnia, paranoia, thought  blocking in the context of running out of her medication.  Patient is currently admitted to inpatient psychiatric facility for stabilization.   Treatment Plan Summary:   Safety and Monitoring:             -- Voluntary admission to inpatient psychiatric unit for safety, stabilization and treatment             -- Daily contact with patient to assess and evaluate symptoms and progress in treatment             -- Patient's case to be discussed in multi-disciplinary team meeting             -- Observation Level: q15 minute checks             -- Vital signs:  q12 hours             -- Precautions: suicide, elopement, and assault   2. Psychiatric Diagnoses and Treatment:               Will continue home medications Zyprexa  15 mg nightly as patient reports good response to it.   -- The risks/benefits/side-effects/alternatives to this medication were discussed in detail with the patient and time was given for questions. The patient consents to medication trial.                -- Metabolic profile and EKG monitoring obtained while  on an atypical antipsychotic (BMI: Lipid Panel: HbgA1c: QTc:)              -- Encouraged patient to participate in unit milieu and in scheduled group therapies                            3. Medical Issues Being Addressed:   no urgent medical needs identified   4. Discharge Planning:              -- Social work and case management to assist with discharge planning and identification of hospital follow-up needs prior to discharge             -- Estimated LOS: 5-7 days             -- Discharge Concerns: Need to establish a safety plan; Medication compliance and effectiveness             -- Discharge Goals: Return home with outpatient referrals follow ups   Aurelia Blotter, MD 05/07/2024, 6:57 PM

## 2024-05-07 NOTE — Plan of Care (Signed)
  Problem: Education: Goal: Utilization of techniques to improve thought processes will improve Outcome: Progressing Goal: Knowledge of the prescribed therapeutic regimen will improve Outcome: Progressing   Problem: Activity: Goal: Interest or engagement in leisure activities will improve Outcome: Progressing Goal: Imbalance in normal sleep/wake cycle will improve Outcome: Progressing   Problem: Coping: Goal: Coping ability will improve Outcome: Progressing Goal: Will verbalize feelings Outcome: Progressing   Problem: Safety: Goal: Ability to disclose and discuss suicidal ideas will improve Outcome: Progressing

## 2024-05-07 NOTE — Progress Notes (Signed)
   05/06/24 2100  Psych Admission Type (Psych Patients Only)  Admission Status Voluntary  Psychosocial Assessment  Patient Complaints None  Eye Contact Fair  Facial Expression Flat  Affect Sad  Speech Logical/coherent  Interaction Assertive  Motor Activity Slow  Appearance/Hygiene In scrubs  Behavior Characteristics Appropriate to situation  Mood Depressed  Thought Process  Coherency WDL  Content WDL  Delusions None reported or observed  Perception WDL  Hallucination None reported or observed  Judgment Impaired  Confusion None  Danger to Self  Current suicidal ideation? Denies   Pt is alert and oriented. Flat affect. Endorses depression 5/10. Limited interaction with peers and staff. Attends group. Po med compliant. Denies SI/HI/AVH. No beh issues noted. No c/o pain/discomfort noted.

## 2024-05-07 NOTE — Progress Notes (Signed)
 Patient is a voluntary admission to [facility] for Bipolar disorder.  Patient is flat, but cooperative with staff.  Stayed in the dayroom most of the day.  Participated in group therapy and interacted with group while they played dominoes.  Denies SI, HI, AVH, anxiety and depression.  Will continue to monitor.

## 2024-05-07 NOTE — BHH Counselor (Signed)
 Adult Comprehensive Assessment  Patient ID: Tracey Ramos, female   DOB: 1956-09-24, 68 y.o.   MRN: [id number]  Information Source: Information source: Patient  Current Stressors:  Patient states their primary concerns and needs for treatment are:: The patient reported that she has been feeling nervous. Patient states their goals for this hospitilization and ongoing recovery are:: The patient stated to be able to go home. Educational / Learning stressors: None reported Employment / Job issues: The patient stated that she does not work. Family Relationships: None reported Financial / Lack of resources (include bankruptcy): None reported Housing / Lack of housing: None reported Physical health (include injuries & life threatening diseases): None reported Social relationships: None reported Substance abuse: The patient stated marijuana Bereavement / Loss: None reported  Living/Environment/Situation:  Living Arrangements: Other relatives Living conditions (as described by patient or guardian): The patient stated good. Who else lives in the home?: The patient stated her grandson. How long has patient lived in current situation?: The patient stated 3 years. What is atmosphere in current home: Comfortable  Family History:  Marital status: Single Does patient have children?: Yes How many children?: 1 How is patient's relationship with their children?: The patient stated the relationship is good.  Childhood History:  By whom was/is the patient raised?: Mother Description of patient's relationship with caregiver when they were a child: The patient stated that the relationship was good. Patient's description of current relationship with people who raised him/her: The patient stated that her mother passed away. How were you disciplined when you got in trouble as a child/adolescent?: The patient stated spanked. Does patient have siblings?: Yes Number of Siblings: 2 Description of patient's  current relationship with siblings: The patient stated good. Did patient suffer any verbal/emotional/physical/sexual abuse as a child?: No Did patient suffer from severe childhood neglect?: No Has patient ever been sexually abused/assaulted/raped as an adolescent or adult?: No Was the patient ever a victim of a crime or a disaster?: No Witnessed domestic violence?: No Has patient been affected by domestic violence as an adult?: No  Education:  Highest grade of school patient has completed: The patient stated high school. Currently a student?: No Learning disability?: No  Employment/Work Situation:   Employment Situation: Unemployed What is the Longest Time Patient has Held a Job?: The patient stated 7 years. Where was the Patient Employed at that Time?: The patient stated [employer]. Has Patient ever Been in the [employer]?: No  Financial Resources:   Surveyor, quantity resources: [employer], [employer], Food stamps Does patient have a Lawyer or guardian?: No  Alcohol/Substance Abuse:   What has been your use of drugs/alcohol within the last 12 months?: The patient stated Marijuana and a possible wine cooler sometimes. If attempted suicide, did drugs/alcohol play a role in this?: No Alcohol/Substance Abuse Treatment Hx: Denies past history Has alcohol/substance abuse ever caused legal problems?: No  Social Support System:   Patient's Community Support System: Good Type of faith/religion: None reported How does patient's faith help to cope with current illness?: The patient stated she goes to church sometimes.  Leisure/Recreation:   Do You Have Hobbies?: Yes Leisure and Hobbies: The patient stated playing games on her phone.  Strengths/Needs:   Patient states these barriers may affect/interfere with their treatment: None reported Patient states these barriers may affect their return to the community: None reported Other important information patient would like considered in  planning for their treatment: None reported  Discharge Plan:   Currently receiving community mental  health services: Yes (From Whom) Patient states concerns and preferences for aftercare planning are: The patient stated she has an ACTT but she would like resources. Patient states they will know when they are safe and ready for discharge when: The patient stated she did not know. Does patient have financial barriers related to discharge medications?: No Will patient be returning to same living situation after discharge?: Yes  Summary/Recommendations:   Summary and Recommendations (to be completed by the evaluator): The patient is a 68 year old [place] American female from [address] [place] [facility]) presenting voluntary to [facility] due altered mental status. Per EMS, "EMS was called because patient woke up from a nap and stated to call 911 that she is having a stroke. Per family, patient has been acting odd for the past few weeks. The patient stated that she has been nervous. The patient reported that she has bipolar disorder.  The patient denies stressors. The patient stated that her grandson lives with her. The patient reported that she does not work. Patient stated that she receives SSI, Sales executive, and Medicare with [facility]. The patient stated no access to weapons. The patient reports use of marijuana and the occasional wine cooler. The patient denies history of substance treatment. The patient reported that she has an ACTT but request resources. Recommendations include crisis stabilization, therapeutic milieu, encourage group attendance and participation, medication management for mood stabilization, and development of a comprehensive mental wellness/sobriety plan.  Santina Cull. 05/07/2024

## 2024-05-08 ENCOUNTER — Telehealth (INDEPENDENT_AMBULATORY_CARE_PROVIDER_SITE_OTHER): Payer: Self-pay | Admitting: Primary Care

## 2024-05-08 DIAGNOSIS — F314 Bipolar disorder, current episode depressed, severe, without psychotic features: Secondary | ICD-10-CM | POA: Diagnosis not present

## 2024-05-08 NOTE — Progress Notes (Signed)
 Patient is a voluntary admission to [facility] for change of mental status hx Bipolar disorder and schizophrenia.  Patient has a flat affect, but is cooperative and participates in group activities including meals.  Patient denies SI, HI, AVH, anxiety and depression. Slept well last night and ate all her meals.  Granddaughter? Called this morning but did not know the code and did not leave a phone number.  Informed Tilia but she didn't  attempt to call her (says she has two grandchildren).  Will continue to monitor.

## 2024-05-08 NOTE — BHH Counselor (Signed)
 CSW contacted third number listed in pt's chart, [phone].   Number listed is for a hospital, and not a direct work line for pt's daughter.    Derrill Flirt, MSW, [place] 05/08/2024 1:44 PM

## 2024-05-08 NOTE — Plan of Care (Signed)
 ?  Problem: Education: ?Goal: Mental status will improve ?Outcome: Progressing ?Goal: Verbalization of understanding the information provided will improve ?Outcome: Progressing ?  ?

## 2024-05-08 NOTE — BH IP Treatment Plan (Addendum)
 Interdisciplinary Treatment and Diagnostic Plan Update  05/08/2024 Time of Session: 10:26 AM  Tracey Ramos MRN: [id number]  Principal Diagnosis: Bipolar 1 disorder, depressed, severe (HCC)  Secondary Diagnoses: Principal Problem:   Bipolar 1 disorder, depressed, severe (HCC)   Current Medications:  Current Facility-Administered Medications  Medication Dose Route Frequency Provider Last Rate Last Admin   acetaminophen  (TYLENOL ) tablet 650 mg  650 mg Oral Q6H PRN Volanda Gruber, NP       alum & mag hydroxide-simeth (MAALOX/MYLANTA) 200-200-20 MG/5ML suspension 30 mL  30 mL Oral Q4H PRN Volanda Gruber, NP       amLODipine  (NORVASC ) tablet 10 mg  10 mg Oral Daily Volanda Gruber, NP   10 mg at 05/08/24 0924   magnesium hydroxide (MILK OF MAGNESIA) suspension 30 mL  30 mL Oral Daily PRN Volanda Gruber, NP       OLANZapine  (ZYPREXA ) injection 5 mg  5 mg Intramuscular TID PRN Volanda Gruber, NP       OLANZapine  (ZYPREXA ) tablet 15 mg  15 mg Oral QHS Volanda Gruber, NP   15 mg at 05/07/24 2131   OLANZapine  zydis (ZYPREXA ) disintegrating tablet 5 mg  5 mg Oral TID PRN Volanda Gruber, NP       rosuvastatin  (CRESTOR ) tablet 40 mg  40 mg Oral Daily Volanda Gruber, NP   40 mg at 05/08/24 4098   PTA Medications: Medications Prior to Admission  Medication Sig Dispense Refill Last Dose/Taking   amLODipine  (NORVASC ) 10 MG tablet Take 1 tablet (10 mg total) by mouth daily. Please schedule appt for further refills. (Patient not taking: Reported on 05/04/2024) 30 tablet 0    OLANZapine  (ZYPREXA ) 15 MG tablet Take 15 mg by mouth at bedtime.      rosuvastatin  (CRESTOR ) 40 MG tablet Take 1 tablet (40 mg total) by mouth daily. Please schedule appt for further refills. (Patient not taking: Reported on 05/04/2024) 30 tablet 0     Patient Stressors: Health problems   Medication change or noncompliance    Patient Strengths: Ability for insight  Capable of independent living  Religious  Affiliation  Supportive family/friends   Treatment Modalities: Medication Management, Group therapy, Case management,  1 to 1 session with clinician, Psychoeducation, Recreational therapy.   Physician Treatment Plan for Primary Diagnosis: Bipolar 1 disorder, depressed, severe (HCC) Long Term Goal(s): Improvement in symptoms so as ready for discharge   Short Term Goals: Ability to identify changes in lifestyle to reduce recurrence of condition will improve Ability to verbalize feelings will improve Ability to disclose and discuss suicidal ideas Ability to demonstrate self-control will improve Ability to identify and develop effective coping behaviors will improve Ability to maintain clinical measurements within normal limits will improve Compliance with prescribed medications will improve Ability to identify triggers associated with substance abuse/mental health issues will improve  Medication Management: Evaluate patient's response, side effects, and tolerance of medication regimen.  Therapeutic Interventions: 1 to 1 sessions, Unit Group sessions and Medication administration.  Evaluation of Outcomes: Progressing  Physician Treatment Plan for Secondary Diagnosis: Principal Problem:   Bipolar 1 disorder, depressed, severe (HCC)  Long Term Goal(s): Improvement in symptoms so as ready for discharge   Short Term Goals: Ability to identify changes in lifestyle to reduce recurrence of condition will improve Ability to verbalize feelings will improve Ability to disclose and discuss suicidal ideas Ability to demonstrate self-control will improve Ability to identify and develop effective coping behaviors will improve Ability to  maintain clinical measurements within normal limits will improve Compliance with prescribed medications will improve Ability to identify triggers associated with substance abuse/mental health issues will improve     Medication Management: Evaluate patient's  response, side effects, and tolerance of medication regimen.  Therapeutic Interventions: 1 to 1 sessions, Unit Group sessions and Medication administration.  Evaluation of Outcomes: Progressing   RN Treatment Plan for Primary Diagnosis: Bipolar 1 disorder, depressed, severe (HCC) Long Term Goal(s): Knowledge of disease and therapeutic regimen to maintain health will improve  Short Term Goals: Ability to remain free from injury will improve, Ability to verbalize frustration and anger appropriately will improve, Ability to demonstrate self-control, Ability to participate in decision making will improve, Ability to verbalize feelings will improve, Ability to disclose and discuss suicidal ideas, Ability to identify and develop effective coping behaviors will improve, and Compliance with prescribed medications will improve  Medication Management: RN will administer medications as ordered by provider, will assess and evaluate patient's response and provide education to patient for prescribed medication. RN will report any adverse and/or side effects to prescribing provider.  Therapeutic Interventions: 1 on 1 counseling sessions, Psychoeducation, Medication administration, Evaluate responses to treatment, Monitor vital signs and CBGs as ordered, Perform/monitor CIWA, COWS, AIMS and Fall Risk screenings as ordered, Perform wound care treatments as ordered.  Evaluation of Outcomes: Progressing   LCSW Treatment Plan for Primary Diagnosis: Bipolar 1 disorder, depressed, severe (HCC) Long Term Goal(s): Safe transition to appropriate next level of care at discharge, Engage patient in therapeutic group addressing interpersonal concerns.  Short Term Goals: Engage patient in aftercare planning with referrals and resources, Increase social support, Increase ability to appropriately verbalize feelings, Increase emotional regulation, Facilitate acceptance of mental health diagnosis and concerns, Facilitate  patient progression through stages of change regarding substance use diagnoses and concerns, Identify triggers associated with mental health/substance abuse issues, and Increase skills for wellness and recovery  Therapeutic Interventions: Assess for all discharge needs, 1 to 1 time with Social worker, Explore available resources and support systems, Assess for adequacy in community support network, Educate family and significant other(s) on suicide prevention, Complete Psychosocial Assessment, Interpersonal group therapy.  Evaluation of Outcomes: Progressing   Progress in Treatment: Attending groups: Yes. and No. Participating in groups: Yes. and No. Taking medication as prescribed: Yes. Toleration medication: Yes. Family/Significant other contact made: No, will contact:  CSW has attempted to contact, unable to reach  Patient understands diagnosis: Yes. Discussing patient identified problems/goals with staff: Yes. Medical problems stabilized or resolved: Yes. Denies suicidal/homicidal ideation: Yes. Issues/concerns per patient self-inventory: No. Other: None   New problem(s) identified: No, Describe:  none identified   New Short Term/Long Term Goal(s): elimination of symptoms of psychosis, medication management for mood stabilization; elimination of SI thoughts; development of comprehensive mental wellness plan.   Patient Goals:  "To maintain"   Discharge Plan or Barriers: CSW will assist with appropriate discharge planning   Reason for Continuation of Hospitalization: Depression Medication stabilization  Estimated Length of Stay: 1 to 7 days   Last 3 [place] Suicide Severity Risk Score: Flowsheet Row Admission (Current) from 05/05/2024 in [facility] ED from 05/04/2024 in [facility] Department at [facility] UC from 05/03/2024 in [facility] at [facility])  C-SSRS RISK CATEGORY No Risk No Risk No Risk        Last PHQ 2/9 Scores:    04/19/2023   11:58 AM 02/11/2023   11:06 AM 12/09/2021  11:27 AM  Depression screen PHQ 2/9  Decreased Interest 0 0 1  Down, Depressed, Hopeless 1 0   PHQ - 2 Score 1 0 1  Altered sleeping   1  Tired, decreased energy   0  Change in appetite   1  Feeling bad or failure about yourself    0  Moving slowly or fidgety/restless   0  Suicidal thoughts   0  PHQ-9 Score   3  Difficult doing work/chores   Somewhat difficult    Scribe for Treatment Team: Claudio Culver, Milinda Allen 05/08/2024 1:44 PM

## 2024-05-08 NOTE — BHH Counselor (Signed)
 CSW attempted to call pt's daughter, Tracey Ramos [phone])   CSW unable to reach, unable to LVM requesting return call.   Derrill Flirt, MSW, [place] 05/08/2024 1:32 PM

## 2024-05-08 NOTE — Progress Notes (Signed)
 [facility] MD Progress Note  05/08/2024 1:51 PM Tracey Ramos  MRN:  [id number]  Patient is a 68 year old female with history of schizoaffective disorder bipolar type admitted for change in mental health status, displaying odd behaviors, worsening insomnia, endorsing paranoia, in the context of running out of her medication. Patient is currently admitted to general psychiatric unit for further stabilization.  Subjective:  Chart reviewed, case discussed in multidisciplinary meeting, patient seen during rounds.  Patient met with with the treatment team and expressed her goals of treatment being feeling better and continue to follow-up with her ACT team.  She was able to recall the name of her ACT team as [facility] and reports seeing Dr. Erminio Ramos.  Patient is declining any change to her medications and wants to continue Zyprexa .  Patient reports feeling better and is noted to be more engaging and visible on the unit.  She is participating in groups and maintain safe behaviors throughout her admission.  She denies SI/HI/plan and denies hallucinations.  She is not responding to internal stimuli.  She reports fair appetite and sleep.  Provider reached out to all the phone numbers present in the chart and given by the nurses [phone] and also [phone] to reach out to patient's daughter Tracey Ramos but they are going to a generic voicemail and the other number went to a female who was unable to identify the daughter's name.  Multiple phone call attempts were made to reach out to the daughter over the weekend with no success by the nursing and the provider   Sleep: Negative  Appetite:  Negative  Past Psychiatric History: see h&P Family History:  Family History  Problem Relation Age of Onset   Depression Mother    Hypertension Father    Heart attack Father    Social History:  Social History   Substance and Sexual Activity  Alcohol Use Yes     Social History   Substance and Sexual Activity  Drug Use Yes    Types: Marijuana    Social History   Socioeconomic History   Marital status: Single    Spouse name: Not on file   Number of children: Not on file   Years of education: Not on file   Highest education level: Not on file  Occupational History   Not on file  Tobacco Use   Smoking status: Former    Types: Cigarettes   Smokeless tobacco: Never  Vaping Use   Vaping status: Never Used  Substance and Sexual Activity   Alcohol use: Yes   Drug use: Yes    Types: Marijuana   Sexual activity: Not Currently  Other Topics Concern   Not on file  Social History Narrative   Not on file   Social Drivers of Health   Financial Resource Strain: Not on file  Food Insecurity: No Food Insecurity (05/05/2024)   Hunger Vital Sign    Worried About Running Out of Food in the Last Year: Never true    Ran Out of Food in the Last Year: Never true  Transportation Needs: No Transportation Needs (05/05/2024)   PRAPARE - Administrator, Civil Service (Medical): No    Lack of Transportation (Non-Medical): No  Physical Activity: Not on file  Stress: Not on file  Social Connections: Moderately Integrated (05/05/2024)   Social Connection and Isolation Panel [NHANES]    Frequency of Communication with Friends and Family: Twice a week    Frequency of Social Gatherings with Friends and Family: Three times  a week    Attends Religious Services: 1 to 4 times per year    Active Member of Clubs or Organizations: Yes    Attends Banker Meetings: 1 to 4 times per year    Marital Status: Never married   Past Medical History:  Past Medical History:  Diagnosis Date   Cholelithiasis    CKD (chronic kidney disease), stage II    Hernia of abdominal wall    Hypertension    Marijuana abuse     Past Surgical History:  Procedure Laterality Date   APPENDECTOMY      Current Medications: Current Facility-Administered Medications  Medication Dose Route Frequency Provider Last Rate Last Admin    acetaminophen  (TYLENOL ) tablet 650 mg  650 mg Oral Q6H PRN Volanda Gruber, NP       alum & mag hydroxide-simeth (MAALOX/MYLANTA) 200-200-20 MG/5ML suspension 30 mL  30 mL Oral Q4H PRN Volanda Gruber, NP       amLODipine  (NORVASC ) tablet 10 mg  10 mg Oral Daily Volanda Gruber, NP   10 mg at 05/08/24 0924   magnesium hydroxide (MILK OF MAGNESIA) suspension 30 mL  30 mL Oral Daily PRN Volanda Gruber, NP       OLANZapine  (ZYPREXA ) injection 5 mg  5 mg Intramuscular TID PRN Volanda Gruber, NP       OLANZapine  (ZYPREXA ) tablet 15 mg  15 mg Oral QHS Volanda Gruber, NP   15 mg at 05/07/24 2131   OLANZapine  zydis (ZYPREXA ) disintegrating tablet 5 mg  5 mg Oral TID PRN Volanda Gruber, NP       rosuvastatin  (CRESTOR ) tablet 40 mg  40 mg Oral Daily Volanda Gruber, NP   40 mg at 05/08/24 8657    Lab Results:  No results found for this or any previous visit (from the past 48 hours).   Blood Alcohol level:  Lab Results  Component Value Date   [facility] <15 05/04/2024   ETH <10 08/06/2022    Metabolic Disorder Labs: Lab Results  Component Value Date   HGBA1C 4.7 (L) 05/06/2024   MPG 88.19 05/06/2024   No results found for: "PROLACTIN" Lab Results  Component Value Date   CHOL 133 05/06/2024   TRIG 64 05/06/2024   HDL 54 05/06/2024   CHOLHDL 2.5 05/06/2024   VLDL 13 05/06/2024   LDLCALC 66 05/06/2024   LDLCALC 149 (H) 02/11/2023    Physical Findings: AIMS:  , ,  ,  ,    CIWA:    COWS:      Psychiatric Specialty Exam:  Presentation  General Appearance:  Appropriate for Environment; Casual  Eye Contact: Fair  Speech: Clear and Coherent  Speech Volume: Normal    Mood and Affect  Mood: Dysphoric  Affect: Inappropriate   Thought Process  Thought Processes: Coherent  Descriptions of Associations:Intact  Orientation:Partial  Thought Content:Logical  Hallucinations:Hallucinations: None  Ideas of Reference:None  Suicidal Thoughts:Suicidal Thoughts:  No  Homicidal Thoughts:Homicidal Thoughts: No   Sensorium  Memory: Immediate Fair; Recent Fair; Remote Poor  Judgment: Impaired  Insight: Shallow   Executive Functions  Concentration: Fair  Attention Span: Fair  Recall: Poor  Fund of Knowledge: Fair  Language: Fair   Psychomotor Activity  Psychomotor Activity: Psychomotor Activity: Normal  Musculoskeletal: Strength & Muscle Tone: within normal limits Gait & Station: normal Assets  Assets: Manufacturing systems engineer; Desire for Improvement; Resilience; Social Support    Physical Exam: Physical Exam Vitals and nursing  note reviewed.    Review of Systems  Constitutional: Negative.   HENT: Negative.    Eyes: Negative.   Cardiovascular: Negative.   Gastrointestinal: Negative.   Skin: Negative.    Blood pressure 119/80, pulse 99, temperature 98.4 F (36.9 C), resp. rate 15, height 5\' 5"  (1.651 m), weight 91.2 kg, SpO2 100%. Body mass index is 33.45 kg/m.  Diagnosis: Principal Problem:   Bipolar 1 disorder, depressed, severe (HCC)  Clinical Decision Making: Possible history of schizoaffective disorder presented to ED with worsening insomnia, paranoia, thought blocking in the context of running out of her medication.  Patient is currently admitted to inpatient psychiatric facility for stabilization.   Treatment Plan Summary:   Safety and Monitoring:             -- Voluntary admission to inpatient psychiatric unit for safety, stabilization and treatment             -- Daily contact with patient to assess and evaluate symptoms and progress in treatment             -- Patient's case to be discussed in multi-disciplinary team meeting             -- Observation Level: q15 minute checks             -- Vital signs:  q12 hours             -- Precautions: suicide, elopement, and assault   2. Psychiatric Diagnoses and Treatment:               Will continue home medications Zyprexa  15 mg nightly as patient  reports good response to it.   -- The risks/benefits/side-effects/alternatives to this medication were discussed in detail with the patient and time was given for questions. The patient consents to medication trial.                -- Metabolic profile and EKG monitoring obtained while on an atypical antipsychotic (BMI: Lipid Panel: HbgA1c: QTc:)              -- Encouraged patient to participate in unit milieu and in scheduled group therapies                            3. Medical Issues Being Addressed:   no urgent medical needs identified   4. Discharge Planning:              -- Social work and case management to assist with discharge planning and identification of hospital follow-up needs prior to discharge             -- Estimated LOS: 2-3 days             -- Discharge Concerns: Need to establish a safety plan; Medication compliance and effectiveness             -- Discharge Goals: Return home with outpatient referrals follow ups   Aurelia Blotter, MD 05/08/2024, 1:51 PM

## 2024-05-08 NOTE — Group Note (Signed)
 Recreation Therapy Group Note   Group Topic:Leisure Education  Group Date: 05/08/2024 Start Time: 1530 End Time: 1630 Facilitators: Deatrice Factor, LRT, CTRS Location: Dayroom  Group Description: Bingo. LRT and patients played multiple games of Bingo with music playing in the background. LRT and pts discussed how this could be a leisure interest and the importance of doing things they enjoy post-discharge. Pts won stress balls as Chief Financial Officer.    Goal Area(s) Addressed: Patient will identify leisure interests.  Patient will practice healthy decision making. Patient will engage in recreation activity.  Patient will increase communication.   Affect/Mood: Appropriate   Participation Level: Active and Engaged   Participation Quality: Independent   Behavior: Appropriate, Calm, and Cooperative   Speech/Thought Process: Coherent   Insight: Good   Judgement: Good   Modes of Intervention: Clarification, Exploration, and Music   Patient Response to Interventions:  Attentive, Engaged, Interested , and Receptive   Education Outcome:  Acknowledges education   Clinical Observations/Individualized Feedback: Ann[place]la[place] was active in their participation of session activities and group discussion. Pt won a Facilities manager and received a stress ball. Pt interacted well with LRT and peers duration of session.    Plan: Continue to engage patient in RT group sessions 2-3x/week.   Deatrice Factor, LRT, CTRS 05/08/2024 5:23 PM

## 2024-05-08 NOTE — Plan of Care (Signed)

## 2024-05-08 NOTE — BHH Counselor (Signed)
 CSW attempted to contact pt's daughter [name] at secondary number listed in the chart [phone]).   CSW reached a female who reports that he does not know anyone by that name.   CSW will inform provider of failed attempts to contact collateral.    Derrill Flirt, MSW, [facility] 05/08/2024 1:41 PM

## 2024-05-08 NOTE — Progress Notes (Signed)
   05/08/24 2100  Charting Type  Charting Type Shift assessment  Safety Check Verification  Has the RN verified the 15 minute safety check completion? Yes  Neurological  Neuro (WDL) WDL  Orientation Level Oriented X4  Cognition Follows commands  Neuro Symptoms Depression  Neuro symptoms relieved by Antidepressant medication  HEENT  HEENT (WDL) X  R Eye Impaired vision  L Eye  (impaired vision)  Respiratory  Respiratory (WDL) WDL  Cardiac  Cardiac (WDL) WDL  Vascular  Vascular (WDL) WDL  Integumentary  Integumentary (WDL) WDL  Braden Scale (Ages 8 and up)  Sensory Perceptions 4  Moisture 4  Activity 4  Mobility 4  Nutrition 3  Friction and Shear 3  Braden Scale Score 22  Musculoskeletal  Musculoskeletal (WDL) WDL  Gastrointestinal  Gastrointestinal (WDL) WDL  Last BM Date  05/08/24  GU Assessment  Genitourinary (WDL) WDL  Neurological  Level of Consciousness Alert

## 2024-05-08 NOTE — Telephone Encounter (Signed)
 Called pt to confirm appt.Pt did not answer and could not LVM.

## 2024-05-08 NOTE — Group Note (Signed)
 Date:  05/08/2024 Time:  8:52 PM  Group Topic/Focus:  Wrap-Up Group:   The focus of this group is to help patients review their daily goal of treatment and discuss progress on daily workbooks.    Participation Level:  Active  Participation Quality:  Appropriate, Attentive, Sharing, and Supportive  Affect:  Appropriate  Cognitive:  Appropriate  Insight: Appropriate  Engagement in Group:  Engaged and Supportive  Modes of Intervention:  Discussion  Additional Comments:     Tracey Ramos 05/08/2024, 8:52 PM

## 2024-05-08 NOTE — Progress Notes (Signed)
   05/07/24 2000  Psych Admission Type (Psych Patients Only)  Admission Status Voluntary  Psychosocial Assessment  Patient Complaints None  Eye Contact Fair  Facial Expression Flat  Affect Flat  Speech Logical/coherent  Interaction Assertive  Motor Activity Slow  Appearance/Hygiene In scrubs  Behavior Characteristics Appropriate to situation  Mood Depressed  Thought Process  Coherency WDL  Content WDL  Delusions None reported or observed  Perception WDL  Hallucination None reported or observed  Judgment Impaired  Confusion None  Danger to Self  Current suicidal ideation? Denies (Denies)  Danger to Others  Danger to Others Reported or observed  Danger to Others Abnormal  Harmful Behavior to others No threats or harm toward other people  Destructive Behavior No threats or harm toward property

## 2024-05-09 ENCOUNTER — Telehealth (INDEPENDENT_AMBULATORY_CARE_PROVIDER_SITE_OTHER): Payer: Self-pay | Admitting: Primary Care

## 2024-05-09 ENCOUNTER — Ambulatory Visit (INDEPENDENT_AMBULATORY_CARE_PROVIDER_SITE_OTHER): Admitting: Primary Care

## 2024-05-09 DIAGNOSIS — F314 Bipolar disorder, current episode depressed, severe, without psychotic features: Secondary | ICD-10-CM | POA: Diagnosis not present

## 2024-05-09 NOTE — Telephone Encounter (Signed)
 Called pt to see if interested in rescheduling a No Show appt. Pt did not answer and Lvm.

## 2024-05-09 NOTE — Group Note (Signed)
 Date:  05/09/2024 Time:  6:10 PM  Group Topic/Focus:  Wellness Toolbox:   The focus of this group is to discuss various aspects of wellness, balancing those aspects and exploring ways to increase the ability to experience wellness.  Patients will create a wellness toolbox for use upon discharge.    Participation Level:  Active  Participation Quality:  Appropriate  Affect:  Appropriate  Cognitive:  Appropriate  Insight: Appropriate  Engagement in Group:  Engaged  Modes of Intervention:  Activity and Socialization  Additional Comments:    Laverne Potter 05/09/2024, 6:10 PM

## 2024-05-09 NOTE — Plan of Care (Signed)
   Problem: Education: Goal: Knowledge of [place] [facility] Education information/materials will improve Outcome: Progressing Goal: Emotional status will improve Outcome: Progressing Goal: Mental status will improve Outcome: Progressing Goal: Verbalization of understanding the information provided will improve Outcome: Progressing

## 2024-05-09 NOTE — Group Note (Signed)
 Recreation Therapy Group Note   Group Topic:Health and Wellness  Group Date: 05/09/2024 Start Time: 1100 End Time: 1140 Facilitators: Deatrice Factor, LRT, CTRS Location: Dayroom  Group Description: Seated Exercise. LRT discussed the mental and physical benefits of exercise. LRT and group discussed how physical activity can be used as a coping skill. Pt's and LRT followed along to an exercise video on the TV screen that provided a visual representation and audio description of every exercise performed. Pt's encouraged to listen to their bodies and stop at any time if they experience feelings of discomfort or pain. Pts were encouraged to drink water and stay hydrated.   Goal Area(s) Addressed: Patient will learn benefits of physical activity. Patient will identify exercise as a coping skill.  Patient will follow multistep directions. Patient will try a new leisure interest.   Affect/Mood: Appropriate   Participation Level: Active and Engaged   Participation Quality: Independent   Behavior: Appropriate, Calm, and Cooperative   Speech/Thought Process: Coherent   Insight: Fair   Judgement: Good   Modes of Intervention: Activity, Education, and Exploration   Patient Response to Interventions:  Attentive, Engaged, and Receptive   Education Outcome:  Acknowledges education   Clinical Observations/Individualized Feedback: Tracey Ramos was active in their participation of session activities and group discussion. Pt completed all exercises as prompted. Pt interacted well with LRT and peers duration of session.    Plan: Continue to engage patient in RT group sessions 2-3x/week.   Deatrice Factor, LRT, CTRS 05/09/2024 1:31 PM

## 2024-05-09 NOTE — Progress Notes (Signed)
   05/09/24 3244  15 Minute Checks  Location Bedroom  Visual Appearance Calm  Behavior Composed  Sleep (Behavioral Health Patients Only)  Calculate sleep? (Click Yes once per 24 hr at 0600 safety check) Yes  Documented sleep last 24 hours 11

## 2024-05-09 NOTE — Progress Notes (Signed)
   05/08/24 2300  Psych Admission Type (Psych Patients Only)  Admission Status Voluntary  Psychosocial Assessment  Patient Complaints None  Eye Contact Fair  Facial Expression Flat  Affect Flat  Speech Logical/coherent  Interaction Assertive  Motor Activity Slow  Appearance/Hygiene In scrubs  Behavior Characteristics Appropriate to situation  Mood Depressed  Thought Process  Coherency WDL  Content WDL  Delusions None reported or observed  Perception WDL  Hallucination None reported or observed  Judgment Impaired  Confusion None  Danger to Self  Current suicidal ideation? Denies (Denies)  Danger to Others  Danger to Others Reported or observed  Danger to Others Abnormal  Harmful Behavior to others No threats or harm toward other people  Destructive Behavior No threats or harm toward property

## 2024-05-09 NOTE — Progress Notes (Signed)
   05/09/24 1300  Psych Admission Type (Psych Patients Only)  Admission Status Voluntary  Psychosocial Assessment  Patient Complaints None  Eye Contact Brief  Facial Expression Flat  Affect Flat  Speech Logical/coherent  Interaction Minimal;No initiation  Motor Activity Slow  Appearance/Hygiene In scrubs  Behavior Characteristics Calm  Mood Sad;Depressed  Thought Process  Coherency WDL  Content WDL  Delusions None reported or observed  Perception WDL  Hallucination None reported or observed  Judgment Impaired  Confusion None  Danger to Self  Current suicidal ideation? Denies  Danger to Others  Danger to Others None reported or observed  Danger to Others Abnormal  Harmful Behavior to others No threats or harm toward other people  Destructive Behavior No threats or harm toward property

## 2024-05-09 NOTE — Group Note (Signed)
 Recreation Therapy Group Note   Group Topic:General Recreation  Group Date: 05/09/2024 Start Time: 1500 End Time: 1540 Facilitators: Deatrice Factor, LRT, CTRS Location: Courtyard  Group Description: Outdoor Recreation. Patients had the option to play corn hole, ring toss, bowling or listening to music while outside in the courtyard getting fresh air and sunlight. Patients helped water and prune the raised garden beds. LRT and patients discussed things that they enjoy doing in their free time outside of the hospital. LRT encouraged patients to drink water after being active and getting their heart rate up.   Goal Area(s) Addressed: Patient will identify leisure interests.  Patient will practice healthy decision making. Patient will engage in recreation activity.   Affect/Mood: Appropriate   Participation Level: Active   Participation Quality: Independent   Behavior: Appropriate   Speech/Thought Process: Coherent   Insight: Fair   Judgement: Fair    Modes of Intervention: Activity   Patient Response to Interventions:  Receptive   Education Outcome:  Acknowledges education   Clinical Observations/Individualized Feedback: Tracey Ramos was activa in their participation of session activities and group discussion. Pt interacted well with LRT and peers duration of session.   Plan: Continue to engage patient in RT group sessions 2-3x/week.   Deatrice Factor, LRT, CTRS 05/09/2024 5:01 PM

## 2024-05-09 NOTE — [facility] Counselor (Signed)
 CSW and provider contacted pt's daughter Guillermina Leer [phone]).  Daughter reports this is her work number.   Daughter reports that pt has an ACT team lead, Rockne Chyle, [phone], but is unsure of the name of the ACT team.    Daughter reports that the pt's son lives with pt, daughter believes that pt will have to come live with her in [place]  eventually because pt needs help with medication administration. Daughter feels that ACT Team is not ensuring she is taking her medication.   Contact Info for pt's daughter has been update by CSW in chart.   E: [email or web address]   Derrill Flirt, MSW, [facility] 05/10/2024 2:58 PM

## 2024-05-09 NOTE — Group Note (Signed)
 Date:  05/09/2024 Time:  11:17 PM  Group Topic/Focus:  Wrap-Up Group:   The focus of this group is to help patients review their daily goal of treatment and discuss progress on daily workbooks.    Participation Level:  Active  Participation Quality:  Appropriate  Affect:  Appropriate  Cognitive:  Appropriate  Insight: Appropriate  Engagement in Group:  Engaged  Modes of Intervention:  Discussion  Additional Comments:    Rolland Cline 05/09/2024, 11:17 PM

## 2024-05-09 NOTE — [facility] Suicide Risk Assessment (Signed)
 [facility] INPATIENT:  Family/Significant Other Suicide Prevention Education  Suicide Prevention Education:  Education Completed; Alverta Avers, daughter, [phone] ,  has been identified by the patient as the family member/significant other with whom the patient will be residing, and identified as the person(s) who will aid the patient in the event of a mental health crisis (suicidal ideations/suicide attempt).  With written consent from the patient, the family member/significant other has been provided the following suicide prevention education, prior to the and/or following the discharge of the patient.  The suicide prevention education provided includes the following: Suicide risk factors Suicide prevention and interventions National Suicide Hotline telephone number [facility] assessment telephone number [facility] Emergency Assistance 911 [facility] and/or Residential Mobile Crisis Unit telephone number  Request made of family/significant other to: Remove weapons (e.g., guns, rifles, knives), all items previously/currently identified as safety concern.   Remove drugs/medications (over-the-counter, prescriptions, illicit drugs), all items previously/currently identified as a safety concern.  The family member/significant other verbalizes understanding of the suicide prevention education information provided.  The family member/significant other agrees to remove the items of safety concern listed above.  Claudio Culver 05/09/2024, 2:34 PM

## 2024-05-09 NOTE — Progress Notes (Signed)
 [facility] MD Progress Note  05/09/2024 12:22 PM Tracey Ramos  MRN:  [id number]  Patient is a 68 year old female with history of schizoaffective disorder bipolar type admitted for change in mental health status, displaying odd behaviors, worsening insomnia, endorsing paranoia, in the context of running out of her medication. Patient is currently admitted to general psychiatric unit for further stabilization.  Subjective:  Chart reviewed, case discussed in multidisciplinary meeting, patient seen during rounds.  Patient is noted to be sitting in the dayroom and offers no complaints.  Patient reports fair appetite and sleep and denies hallucinations.  Patient is not endorsing suicidal/homicidal ideation/plan.  She did acknowledge that her granddaughter called her yesterday.  Provider encouraged patient to call her family today.  Later in the day provider and social worker call the numbers provided that states the numbers belong to patient's daughter.  Eventually the daughter picked up the phone and provider was able to update the treatment plan.  Daughter reports patient having history of schizophrenia and lately showing some memory problems.  Daughter informed the team that patient is living currently with daughters children and grand children.  Daughter was updated about possible discharge on Thursday with outpatient follow-ups.  Daughter reports that she works in [place]  and wants to be informed when patient gets released from the hospital.   Sleep: Negative  Appetite:  Negative  Past Psychiatric History: see h&P Family History:  Family History  Problem Relation Age of Onset   Depression Mother    Hypertension Father    Heart attack Father    Social History:  Social History   Substance and Sexual Activity  Alcohol Use Yes     Social History   Substance and Sexual Activity  Drug Use Yes   Types: Marijuana    Social History   Socioeconomic History   Marital status: Single    Spouse  name: Not on file   Number of children: Not on file   Years of education: Not on file   Highest education level: Not on file  Occupational History   Not on file  Tobacco Use   Smoking status: Former    Types: Cigarettes   Smokeless tobacco: Never  Vaping Use   Vaping status: Never Used  Substance and Sexual Activity   Alcohol use: Yes   Drug use: Yes    Types: Marijuana   Sexual activity: Not Currently  Other Topics Concern   Not on file  Social History Narrative   Not on file   Social Drivers of Health   Financial Resource Strain: Not on file  Food Insecurity: No Food Insecurity (05/05/2024)   Hunger Vital Sign    Worried About Running Out of Food in the Last Year: Never true    Ran Out of Food in the Last Year: Never true  Transportation Needs: No Transportation Needs (05/05/2024)   PRAPARE - Administrator, Civil Service (Medical): No    Lack of Transportation (Non-Medical): No  Physical Activity: Not on file  Stress: Not on file  Social Connections: Moderately Integrated (05/05/2024)   Social Connection and Isolation Panel [NHANES]    Frequency of Communication with Friends and Family: Twice a week    Frequency of Social Gatherings with Friends and Family: Three times a week    Attends Religious Services: 1 to 4 times per year    Active Member of Clubs or Organizations: Yes    Attends Banker Meetings: 1 to 4 times per year  Marital Status: Never married   Past Medical History:  Past Medical History:  Diagnosis Date   Cholelithiasis    CKD (chronic kidney disease), stage II    Hernia of abdominal wall    Hypertension    Marijuana abuse     Past Surgical History:  Procedure Laterality Date   APPENDECTOMY      Current Medications: Current Facility-Administered Medications  Medication Dose Route Frequency Provider Last Rate Last Admin   acetaminophen  (TYLENOL ) tablet 650 mg  650 mg Oral Q6H PRN Volanda Gruber, NP       alum & mag  hydroxide-simeth (MAALOX/MYLANTA) 200-200-20 MG/5ML suspension 30 mL  30 mL Oral Q4H PRN Volanda Gruber, NP       amLODipine  (NORVASC ) tablet 10 mg  10 mg Oral Daily Volanda Gruber, NP   10 mg at 05/09/24 0932   magnesium hydroxide (MILK OF MAGNESIA) suspension 30 mL  30 mL Oral Daily PRN Volanda Gruber, NP       OLANZapine  (ZYPREXA ) injection 5 mg  5 mg Intramuscular TID PRN Volanda Gruber, NP       OLANZapine  (ZYPREXA ) tablet 15 mg  15 mg Oral QHS Volanda Gruber, NP   15 mg at 05/08/24 2122   OLANZapine  zydis (ZYPREXA ) disintegrating tablet 5 mg  5 mg Oral TID PRN Volanda Gruber, NP       rosuvastatin  (CRESTOR ) tablet 40 mg  40 mg Oral Daily Volanda Gruber, NP   40 mg at 05/09/24 0932    Lab Results:  No results found for this or any previous visit (from the past 48 hours).   Blood Alcohol level:  Lab Results  Component Value Date   [facility] <15 05/04/2024   ETH <10 08/06/2022    Metabolic Disorder Labs: Lab Results  Component Value Date   HGBA1C 4.7 (L) 05/06/2024   MPG 88.19 05/06/2024   No results found for: "PROLACTIN" Lab Results  Component Value Date   CHOL 133 05/06/2024   TRIG 64 05/06/2024   HDL 54 05/06/2024   CHOLHDL 2.5 05/06/2024   VLDL 13 05/06/2024   LDLCALC 66 05/06/2024   LDLCALC 149 (H) 02/11/2023    Physical Findings: AIMS:  , ,  ,  ,    CIWA:    COWS:      Psychiatric Specialty Exam:  Presentation  General Appearance:  Appropriate for Environment; Casual  Eye Contact: Fair  Speech: Clear and Coherent; Slow  Speech Volume: Decreased    Mood and Affect  Mood: Euthymic  Affect: Appropriate   Thought Process  Thought Processes: Coherent  Descriptions of Associations:Intact  Orientation:Partial  Thought Content:Logical  Hallucinations:Hallucinations: None  Ideas of Reference:None  Suicidal Thoughts:Suicidal Thoughts: No  Homicidal Thoughts:Homicidal Thoughts: No   Sensorium  Memory: Immediate Fair;  Recent Fair; Remote Poor  Judgment: Impaired  Insight: Shallow   Executive Functions  Concentration: Fair  Attention Span: Fair  Recall: Fair  Fund of Knowledge: Fair  Language: Fair   Psychomotor Activity  Psychomotor Activity: Psychomotor Activity: Normal  Musculoskeletal: Strength & Muscle Tone: within normal limits Gait & Station: normal Assets  Assets: Manufacturing systems engineer; Desire for Improvement; Resilience    Physical Exam: Physical Exam Vitals and nursing note reviewed.    Review of Systems  Constitutional: Negative.   HENT: Negative.    Eyes: Negative.   Cardiovascular: Negative.   Gastrointestinal: Negative.   Skin: Negative.    Blood pressure 119/80, pulse 99, temperature 98.4  F (36.9 C), resp. rate 15, height 5\' 5"  (1.651 m), weight 91.2 kg, SpO2 100%. Body mass index is 33.45 kg/m.  Diagnosis: Principal Problem:   Bipolar 1 disorder, depressed, severe (HCC)  Clinical Decision Making: Possible history of schizoaffective disorder presented to ED with worsening insomnia, paranoia, thought blocking in the context of running out of her medication.  Patient is currently admitted to inpatient psychiatric facility for stabilization.   Treatment Plan Summary:   Safety and Monitoring:             -- Voluntary admission to inpatient psychiatric unit for safety, stabilization and treatment             -- Daily contact with patient to assess and evaluate symptoms and progress in treatment             -- Patient's case to be discussed in multi-disciplinary team meeting             -- Observation Level: q15 minute checks             -- Vital signs:  q12 hours             -- Precautions: suicide, elopement, and assault   2. Psychiatric Diagnoses and Treatment:               Will continue home medications Zyprexa  15 mg nightly as patient reports good response to it.   -- The risks/benefits/side-effects/alternatives to this medication were  discussed in detail with the patient and time was given for questions. The patient consents to medication trial.                -- Metabolic profile and EKG monitoring obtained while on an atypical antipsychotic (BMI: Lipid Panel: HbgA1c: QTc:)              -- Encouraged patient to participate in unit milieu and in scheduled group therapies                            3. Medical Issues Being Addressed:   no urgent medical needs identified   4. Discharge Planning:              -- Social work and case management to assist with discharge planning and identification of hospital follow-up needs prior to discharge             -- Estimated LOS: 2-3 days             -- Discharge Concerns: Need to establish a safety plan; Medication compliance and effectiveness             -- Discharge Goals: Return home with outpatient referrals follow ups   Aurelia Blotter, MD 05/09/2024, 12:22 PM

## 2024-05-10 DIAGNOSIS — F314 Bipolar disorder, current episode depressed, severe, without psychotic features: Secondary | ICD-10-CM | POA: Diagnosis not present

## 2024-05-10 NOTE — Group Note (Signed)
 Physical/Occupational Therapy Group Note  Group Topic: Yoga  Group Date: 05/10/2024 Start Time: 1300 End Time: 1330 Facilitators: Memphis Creswell, Otelia Blew, PT   Group Description: Group participated with series of yoga poses, designed to emphasize functional sitting balance, core stability, generalized flexibility and overall posture.  Incorporated deep breathing techniques with poses, working to promote relaxation, mindfulness and focus with targeted activities.   Discussed benefits of yoga in improving mood and self-esteem, reducing stress and anxiety, and promoting functional strength and balance for each participant.  Discussed ways to integrate into each participant's daily routine.    Therapeutic Goal(s):  Demonstrate safe ability to participate with yoga poses during group activity. Identify one benefit of participation with yoga poses as part of each participant's exercise/movement routine. Identify 1-2 individual poses that participant feels most beneficial to his/her needs and that he/she can easily replicate outside of group.  Individual Participation: Pt pleasant, engaged, consistently appropriate, and actively participated throughout the session.   Participation Level: Active and Engaged   Participation Quality: Minimal Cues   Behavior: Appropriate   Speech/Thought Process: Coherent and Organized   Affect/Mood: Appropriate   Insight: Good   Judgement: Good   Modes of Intervention: Activity and Discussion  Patient Response to Interventions:  Attentive and Engaged   Plan: Continue to engage patient in PT/OT groups 1 - 2x/week.  Lavenia Post PT, DPT 05/10/24, 1:53 PM

## 2024-05-10 NOTE — B[facility] Counselor (Signed)
 CSW contacted [facility] [phone]) for pt's ACT team per daughter. CSW unable to reach, left HIPAA compliant VM requesting return call.   Derrill Flirt, MSW, [facility] 05/10/2024 9:48 AM

## 2024-05-10 NOTE — Progress Notes (Signed)
   05/10/24 1200  Psych Admission Type (Psych Patients Only)  Admission Status Voluntary  Psychosocial Assessment  Patient Complaints None  Eye Contact Brief  Facial Expression Flat  Affect Flat  Speech Logical/coherent  Interaction Minimal;No initiation  Motor Activity Slow  Appearance/Hygiene In scrubs  Thought Process  Coherency WDL  Content WDL  Delusions None reported or observed  Perception WDL  Hallucination None reported or observed  Judgment Impaired  Confusion None  Danger to Self  Current suicidal ideation? Denies  Danger to Others  Danger to Others None reported or observed  Danger to Others Abnormal  Harmful Behavior to others No threats or harm toward other people  Destructive Behavior No threats or harm toward property

## 2024-05-10 NOTE — Plan of Care (Signed)
 Problem: Activity: Goal: Interest or engagement in activities will improve Outcome: Progressing Goal: Sleeping patterns will improve Outcome: Progressing   Problem: Coping: Goal: Ability to verbalize frustrations and anger appropriately will improve Outcome: Progressing Goal: Ability to demonstrate self-control will improve Outcome: Progressing   Problem: Health Behavior/Discharge Planning: Goal: Identification of resources available to assist in meeting health care needs will improve Outcome: Progressing Goal: Compliance with treatment plan for underlying cause of condition will improve Outcome: Progressing   Problem: Physical Regulation: Goal: Ability to maintain clinical measurements within normal limits will improve Outcome: Progressing   Problem: Safety: Goal: Periods of time without injury will increase Outcome: Progressing    Problem: Coping: Goal: Coping ability will improve Outcome: Progressing Goal: Will verbalize feelings Outcome: Progressing   Problem: Safety: Goal: Ability to disclose and discuss suicidal ideas will improve Outcome: Progressing Goal: Ability to identify and utilize support systems that promote safety will improve Outcome: Progressing   Problem: Self-Concept: Goal: Will verbalize positive feelings about self Outcome: Progressing Goal: Level of anxiety will decrease Outcome: Progressing

## 2024-05-10 NOTE — Progress Notes (Signed)
 [facility]  05/10/2024 1:04 PM Tracey Ramos  MRN:  [id number]  Patient is a 68 year old female with history of schizoaffective disorder bipolar type admitted for change in mental health status, displaying odd behaviors, worsening insomnia, endorsing paranoia, in the context of running out of her medication. Patient is currently admitted to general psychiatric unit for further stabilization.  Subjective:  Chart reviewed, case discussed in multidisciplinary meeting, patient seen during rounds.  Son reports patient is visible on the unit, engaging in the groups and with peers.  She remains verbally minimal but is not displaying any behavioral problems.  She reports her depression and anxiety improved.  She denies suicidal/homicidal ideation/plan.  She denies auditory/visual hallucinations.  She is taking her medications with no reported side effects.  She reports talking to her daughter about her discharge home tomorrow.   Sleep: Negative  Appetite:  Negative  Past Psychiatric History: see h&P Family History:  Family History  Problem Relation Age of Onset   Depression Mother    Hypertension Father    Heart attack Father    Social History:  Social History   Substance and Sexual Activity  Alcohol Use Yes     Social History   Substance and Sexual Activity  Drug Use Yes   Types: Marijuana    Social History   Socioeconomic History   Marital status: Single    Spouse name: Not on file   Number of children: Not on file   Years of education: Not on file   Highest education level: Not on file  Occupational History   Not on file  Tobacco Use   Smoking status: Former    Types: Cigarettes   Smokeless tobacco: Never  Vaping Use   Vaping status: Never Used  Substance and Sexual Activity   Alcohol use: Yes   Drug use: Yes    Types: Marijuana   Sexual activity: Not Currently  Other Topics Concern   Not on file  Social History Narrative   Not on file   Social  Drivers of Health   Financial Resource Strain: Not on file  Food Insecurity: No Food Insecurity (05/05/2024)   Hunger Vital Sign    Worried About Running Out of Food in the Last Year: Never true    Ran Out of Food in the Last Year: Never true  Transportation Needs: No Transportation Needs (05/05/2024)   PRAPARE - Administrator, Civil Service (Medical): No    Lack of Transportation (Non-Medical): No  Physical Activity: Not on file  Stress: Not on file  Social Connections: Moderately Integrated (05/05/2024)   Social Connection and Isolation Panel [NHANES]    Frequency of Communication with Friends and Family: Twice a week    Frequency of Social Gatherings with Friends and Family: Three times a week    Attends Religious Services: 1 to 4 times per year    Active Member of Clubs or Organizations: Yes    Attends Banker Meetings: 1 to 4 times per year    Marital Status: Never married   Past Medical History:  Past Medical History:  Diagnosis Date   Cholelithiasis    CKD (chronic kidney disease), stage II    Hernia of abdominal wall    Hypertension    Marijuana abuse     Past Surgical History:  Procedure Laterality Date   APPENDECTOMY      Current Medications: Current Facility-Administered Medications  Medication Dose Route Frequency Provider Last Rate Last Admin  acetaminophen  (TYLENOL ) tablet 650 mg  650 mg Oral Q6H PRN Volanda Gruber, NP       alum & mag hydroxide-simeth (MAALOX/MYLANTA) 200-200-20 MG/5ML suspension 30 mL  30 mL Oral Q4H PRN Volanda Gruber, NP       amLODipine  (NORVASC ) tablet 10 mg  10 mg Oral Daily Volanda Gruber, NP   10 mg at 05/10/24 9147   magnesium hydroxide (MILK OF MAGNESIA) suspension 30 mL  30 mL Oral Daily PRN Volanda Gruber, NP       OLANZapine  (ZYPREXA ) injection 5 mg  5 mg Intramuscular TID PRN Volanda Gruber, NP       OLANZapine  (ZYPREXA ) tablet 15 mg  15 mg Oral QHS Volanda Gruber, NP   15 mg at 05/09/24 2117    OLANZapine  zydis (ZYPREXA ) disintegrating tablet 5 mg  5 mg Oral TID PRN Volanda Gruber, NP       rosuvastatin  (CRESTOR ) tablet 40 mg  40 mg Oral Daily Volanda Gruber, NP   40 mg at 05/10/24 8295    Lab Results:  No results found for this or any previous visit (from the past 48 hours).   Blood Alcohol level:  Lab Results  Component Value Date   [facility] <15 05/04/2024   ETH <10 08/06/2022    Metabolic Disorder Labs: Lab Results  Component Value Date   HGBA1C 4.7 (L) 05/06/2024   MPG 88.19 05/06/2024   No results found for: "PROLACTIN" Lab Results  Component Value Date   CHOL 133 05/06/2024   TRIG 64 05/06/2024   HDL 54 05/06/2024   CHOLHDL 2.5 05/06/2024   VLDL 13 05/06/2024   LDLCALC 66 05/06/2024   LDLCALC 149 (H) 02/11/2023    Physical Findings: AIMS:  , ,  ,  ,    CIWA:    COWS:      Psychiatric Specialty Exam:  Presentation  General Appearance:  Appropriate for Environment; Casual  Eye Contact: Fair  Speech: Slow  Speech Volume: Normal    Mood and Affect  Mood: Euthymic  Affect: Flat   Thought Process  Thought Processes: Linear  Descriptions of Associations:Intact  Orientation:Partial  Thought Content:Logical  Hallucinations:Hallucinations: None  Ideas of Reference:None  Suicidal Thoughts:Suicidal Thoughts: No  Homicidal Thoughts:Homicidal Thoughts: No   Sensorium  Memory: Immediate Fair; Recent Poor; Remote Poor  Judgment: Impaired  Insight: Shallow   Executive Functions  Concentration: Fair  Attention Span: Fair  Recall: Poor  Fund of Knowledge: Poor  Language: Fair   Psychomotor Activity  Psychomotor Activity: Psychomotor Activity: Normal  Musculoskeletal: Strength & Muscle Tone: within normal limits Gait & Station: normal Assets  Assets: Resilience; Social Support; Housing    Physical Exam: Physical Exam Vitals and nursing note reviewed.    Review of Systems  Constitutional:  Negative.   HENT: Negative.    Eyes: Negative.   Cardiovascular: Negative.   Gastrointestinal: Negative.   Skin: Negative.    Blood pressure 119/80, pulse 99, temperature 98.4 F (36.9 C), resp. rate 15, height 5\' 5"  (1.651 m), weight 91.2 kg, SpO2 100%. Body mass index is 33.45 kg/m.  Diagnosis: Principal Problem:   Bipolar 1 disorder, depressed, severe (HCC)  Clinical Decision Making: Possible history of schizoaffective disorder presented to ED with worsening insomnia, paranoia, thought blocking in the context of running out of her medication.  Patient is currently admitted to inpatient psychiatric facility for stabilization.   Treatment Plan Summary:   Safety and Monitoring:             --  Voluntary admission to inpatient psychiatric unit for safety, stabilization and treatment             -- Daily contact with patient to assess and evaluate symptoms and progress in treatment             -- Patient's case to be discussed in multi-disciplinary team meeting             -- Observation Level: q15 minute checks             -- Vital signs:  q12 hours             -- Precautions: suicide, elopement, and assault   2. Psychiatric Diagnoses and Treatment:               Will continue home medications Zyprexa  15 mg nightly as patient reports good response to it.   -- The risks/benefits/side-effects/alternatives to this medication were discussed in detail with the patient and time was given for questions. The patient consents to medication trial.                -- Metabolic profile and EKG monitoring obtained while on an atypical antipsychotic (BMI: Lipid Panel: HbgA1c: QTc:)              -- Encouraged patient to participate in unit milieu and in scheduled group therapies                            3. Medical Issues Being Addressed:   no urgent medical needs identified   4. Discharge Planning:              -- Social work and case management to assist with discharge planning and  identification of hospital follow-up needs prior to discharge             -- Estimated LOS: 2-3 days             -- Discharge Concerns: Need to establish a safety plan; Medication compliance and effectiveness             -- Discharge Goals: Return home with outpatient referrals follow ups   Aurelia Blotter, MD 05/10/2024, 1:04 PM

## 2024-05-10 NOTE — Group Note (Signed)
 Date:  05/10/2024 Time:  4:22 PM  Group Topic/Focus: Group Activity- Bingo        Participation Level:  Active  Participation Quality:  Appropriate  Affect:  Appropriate

## 2024-05-10 NOTE — Progress Notes (Signed)
   05/10/24 0500  Psych Admission Type (Psych Patients Only)  Admission Status Voluntary  Psychosocial Assessment  Patient Complaints None  Eye Contact Brief  Facial Expression Flat  Affect Flat  Speech Logical/coherent  Interaction Minimal;No initiation  Motor Activity Slow  Appearance/Hygiene In scrubs  Behavior Characteristics Calm  Mood Sad;Depressed  Thought Process  Coherency WDL  Content WDL  Delusions None reported or observed  Perception WDL  Hallucination None reported or observed  Judgment Impaired  Confusion None  Danger to Self  Current suicidal ideation? Denies  Danger to Others  Danger to Others None reported or observed  Danger to Others Abnormal  Harmful Behavior to others No threats or harm toward other people  Destructive Behavior No threats or harm toward property

## 2024-05-11 DIAGNOSIS — F314 Bipolar disorder, current episode depressed, severe, without psychotic features: Secondary | ICD-10-CM | POA: Diagnosis not present

## 2024-05-11 MED ORDER — AMLODIPINE BESYLATE 10 MG PO TABS: 10.0000 mg | ORAL_TABLET | Freq: Every day | ORAL | 0 refills | Status: DC

## 2024-05-11 MED ORDER — ROSUVASTATIN CALCIUM 40 MG PO TABS: 40.0000 mg | ORAL_TABLET | Freq: Every day | ORAL | 0 refills | Status: DC

## 2024-05-11 MED ORDER — OLANZAPINE 15 MG PO TABS: 15.0000 mg | ORAL_TABLET | Freq: Every day | ORAL | 0 refills | Status: AC

## 2024-05-11 NOTE — Progress Notes (Signed)
  [facility] Adult Case Management Discharge Plan :  Will you be returning to the same living situation after discharge:  Yes,  pt reports that she is returning home.  At discharge, do you have transportation home?: Yes,  family will provide transportation.  Do you have the ability to pay for your medications: Yes,  : [employer]ICARE / [place] DUAL COMPLETE  Release of information consent forms completed and in the chart;  Patient's signature needed at discharge.  Patient to Follow up at:  Follow-up Information     [facility] Follow up.   Why: Appointment is scheduled for 05/12/2024 around 1PM. Contact information: [address] [place] [facility] [phone]                 Next level of care provider has access to [facility] Link:no  Safety Planning and Suicide Prevention discussed: Yes,  SPE completed with the patient and family     Has patient been referred to the Quitline?: Patient does not use tobacco/nicotine products  Patient has been referred for addiction treatment: No known substance use disorder.  Larri Ply, LCSW 05/11/2024, 12:19 PM

## 2024-05-11 NOTE — Plan of Care (Signed)
 Patient resting quietly in bed with eyes closed. Respirations equal and unlabored, skin warm and dry, NAD. No change in assessment or acuity. Q 15 min safety checks maintained/ongoing. Will continue to monitor for safety.   Problem: Education: Goal: Knowledge of [facility] Education information/materials will improve Outcome: Progressing Goal: Emotional status will improve Outcome: Progressing Goal: Mental status will improve Outcome: Progressing Goal: Verbalization of understanding the information provided will improve Outcome: Progressing   Problem: Activity: Goal: Interest or engagement in activities will improve Outcome: Progressing Goal: Sleeping patterns will improve Outcome: Progressing   Problem: Health Behavior/Discharge Planning: Goal: Identification of resources available to assist in meeting health care needs will improve Outcome: Progressing Goal: Compliance with treatment plan for underlying cause of condition will improve Outcome: Progressing   Problem: Safety: Goal: Periods of time without injury will increase Outcome: Progressing

## 2024-05-11 NOTE — Progress Notes (Signed)
 Patient is pleasant and cooperative.  Flat affect.  Denies SI/HI, AVH, anxiety and depression.  Denies pain. Reports she slept well.  Compliant with scheduled medications.  15 min checks in place for safety.  Patient present in the dayroom.  Minimal interaction with peers.   Patient will discharge home today.

## 2024-05-11 NOTE — Progress Notes (Signed)
   05/11/24 0606  15 Minute Checks  Location Bedroom  Visual Appearance Calm  Behavior Sleeping  Sleep (Behavioral Health Patients Only)  Calculate sleep? (Click Yes once per 24 hr at 0600 safety check) Yes  Documented sleep last 24 hours 9.75

## 2024-05-11 NOTE — Progress Notes (Signed)
 Pt d/c alert and oriented x 4. Denies SI/HI/AVH. Pt picked up by family member (niece); D/C pprwrk reviewed with pt by clinician accordingly.

## 2024-05-11 NOTE — BHH Suicide Risk Assessment (Signed)
 [facility] Discharge Suicide Risk Assessment   Principal Problem: Bipolar 1 disorder, depressed, severe (HCC) Discharge Diagnoses: Principal Problem:   Bipolar 1 disorder, depressed, severe (HCC)   Total Time spent with patient: 30 minutes  Musculoskeletal: Strength & Muscle Tone: within normal limits Gait & Station: normal Patient leans: N/A  Psychiatric Specialty Exam  Presentation  General Appearance:  Appropriate for Environment; Casual  Eye Contact: Fair  Speech: Slow  Speech Volume: Normal  Handedness: Right   Mood and Affect  Mood: Euthymic  Duration of Depression Symptoms: Greater than two weeks  Affect: Flat   Thought Process  Thought Processes: Linear  Descriptions of Associations:Intact  Orientation:Partial  Thought Content:Logical  History of Schizophrenia/Schizoaffective disorder:No  Duration of Psychotic Symptoms:No data recorded Hallucinations:Hallucinations: None  Ideas of Reference:None  Suicidal Thoughts:Suicidal Thoughts: No  Homicidal Thoughts:Homicidal Thoughts: No   Sensorium  Memory: Immediate Fair; Recent Poor; Remote Poor  Judgment: Impaired  Insight: Shallow   Executive Functions  Concentration: Fair  Attention Span: Fair  Recall: Poor  Fund of Knowledge: Poor  Language: Fair   Psychomotor Activity  Psychomotor Activity: Psychomotor Activity: Normal   Assets  Assets: Resilience; Social Support; Housing   Sleep  Sleep: Sleep: Fair   Physical Exam: Physical Exam ROS Blood pressure 135/88, pulse (!) 103, temperature 98.2 F (36.8 C), resp. rate 20, height 5\' 5"  (1.651 m), weight 91.2 kg, SpO2 99%. Body mass index is 33.45 kg/m.  Mental Status Per Nursing Assessment::   On Admission:  NA  Demographic Factors:  Low socioeconomic status  Loss Factors: Decrease in vocational status  Historical Factors: NA  Risk Reduction Factors:   Sense of responsibility to family, Living with  another person, especially a relative, Positive social support, Positive therapeutic relationship, and Positive coping skills or problem solving skills  Continued Clinical Symptoms:  Schizophrenia:   Depressive state  Cognitive Features That Contribute To Risk:  None    Suicide Risk:  Minimal: No identifiable suicidal ideation.  Patients presenting with no risk factors but with morbid ruminations; may be classified as minimal risk based on the severity of the depressive symptoms    Plan Of Care/Follow-up recommendations:  Activity:  As tolerated  Aurelia Blotter, MD 05/11/2024, 10:50 AM

## 2024-05-11 NOTE — Group Note (Signed)
 Recreation Therapy Group Note   Group Topic:Relaxation  Group Date: 05/11/2024 Start Time: 1100 End Time: 1140 Facilitators: Deatrice Factor, LRT, CTRS Location: Dayroom  Group Description: PMR (Progressive Muscle Relaxation). LRT educates patients on what PMR is and the benefits that come from it. Patients are asked to sit with their feet flat on the floor while sitting up and all the way back in their chair, if possible. LRT and pts follow a prompt through a speaker that requires you to tense and release different muscles in their body and focus on their breathing. During session, lights are off and soft music is being played. Pts are given a stress ball to use if needed.   Goal Area(s) Addressed:  Patients will be able to describe progressive muscle relaxation.  Patient will practice using relaxation technique. Patient will identify a new coping skill.  Patient will follow multistep directions to reduce anxiety and stress.   Affect/Mood: Appropriate   Participation Level: Active and Engaged   Participation Quality: Independent   Behavior: Appropriate, Calm, and Cooperative   Speech/Thought Process: Coherent   Insight: Fair   Judgement: Good   Modes of Intervention: Education and Exploration   Patient Response to Interventions:  Receptive   Education Outcome:  Acknowledges education   Clinical Observations/Individualized Feedback: Kiya was active in their participation of session activities and group discussion. Pt completed all exercises as prompted. Pt interacted well with LRT and peers duration of session.    Plan: Continue to engage patient in RT group sessions 2-3x/week.   Deatrice Factor, LRT, CTRS 05/11/2024 1:37 PM

## 2024-05-11 NOTE — Progress Notes (Signed)
   05/10/24 2130  Psych Admission Type (Psych Patients Only)  Admission Status Voluntary  Psychosocial Assessment  Patient Complaints None  Eye Contact Brief  Facial Expression Flat  Affect Flat  Speech Logical/coherent  Interaction Minimal;No initiation  Motor Activity Slow  Appearance/Hygiene Unremarkable  Behavior Characteristics Cooperative;Appropriate to situation  Mood Depressed  Aggressive Behavior  Effect No apparent injury  Thought Process  Coherency WDL  Content WDL  Delusions None reported or observed  Perception WDL  Hallucination None reported or observed  Judgment Impaired  Confusion None  Danger to Self  Current suicidal ideation? Denies  Danger to Others  Danger to Others None reported or observed  Danger to Others Abnormal  Harmful Behavior to others No threats or harm toward other people  Destructive Behavior No threats or harm toward property

## 2024-05-11 NOTE — Care Management Important Message (Signed)
 Important Message  Patient Details  Name: Tracey Ramos MRN: [id number] Date of Birth: 08-Oct-1956   Important Message Given:  Yes - Medicare IM     Larri Ply, LCSW 05/11/2024, 1:21 PM

## 2024-05-11 NOTE — Plan of Care (Signed)
  Problem: Coping: Goal: Ability to demonstrate self-control will improve Outcome: Progressing   Problem: Health Behavior/Discharge Planning: Goal: Compliance with treatment plan for underlying cause of condition will improve Outcome: Progressing   Problem: Self-Concept: Goal: Level of anxiety will decrease Outcome: Progressing

## 2024-05-11 NOTE — Progress Notes (Incomplete)
 Discharge Note:  Patient denies SI/HI/AVH at this time. Discharge instructions, AVS, prescriptions, and transition record reviewed with patient. Patient agrees to comply with medication management, follow-up visit, and outpatient therapy. Patient belongings returned to patient. Patient questions and concerns addressed and answered. Patient ambulatory off unit. Patient discharged to home with adult grandchildren.   Patient declined Suicide Safety Plan.  "I was never suicidal."

## 2024-05-11 NOTE — Discharge Summary (Signed)
 Physician Discharge Summary Note  Patient:  Tracey Ramos is an 68 y.o., female MRN:  [id number] DOB:  September 13, 1956 Patient phone:  [phone] (home)  Patient address:   [address] [place] [address],    Date of Admission:  05/05/2024 Date of Discharge: 05/11/24  Reason for Admission:  Patient is a 68 year old female with history of schizoaffective disorder bipolar type admitted for change in mental health status, displaying odd behaviors, worsening insomnia, endorsing paranoia, in the context of running out of her medication. Patient is currently admitted to general psychiatric unit for further stabilization.   Principal Problem: Bipolar 1 disorder, depressed, severe (HCC) Discharge Diagnoses: Principal Problem:   Bipolar 1 disorder, depressed, severe (HCC)   Past Psychiatric History: see h&P  Family Psychiatric  History: see h&p Social History:  Social History   Substance and Sexual Activity  Alcohol Use Yes     Social History   Substance and Sexual Activity  Drug Use Yes   Types: Marijuana    Social History   Socioeconomic History   Marital status: Single    Spouse name: Not on file   Number of children: Not on file   Years of education: Not on file   Highest education level: Not on file  Occupational History   Not on file  Tobacco Use   Smoking status: Former    Types: Cigarettes   Smokeless tobacco: Never  Vaping Use   Vaping status: Never Used  Substance and Sexual Activity   Alcohol use: Yes   Drug use: Yes    Types: Marijuana   Sexual activity: Not Currently  Other Topics Concern   Not on file  Social History Narrative   Not on file   Social Drivers of Health   Financial Resource Strain: Not on file  Food Insecurity: No Food Insecurity (05/05/2024)   Hunger Vital Sign    Worried About Running Out of Food in the Last Year: Never true    Ran Out of Food in the Last Year: Never true  Transportation Needs: No Transportation  Needs (05/05/2024)   PRAPARE - Administrator, Civil Service (Medical): No    Lack of Transportation (Non-Medical): No  Physical Activity: Not on file  Stress: Not on file  Social Connections: Moderately Integrated (05/05/2024)   Social Connection and Isolation Panel [NHANES]    Frequency of Communication with Friends and Family: Twice a week    Frequency of Social Gatherings with Friends and Family: Three times a week    Attends Religious Services: 1 to 4 times per year    Active Member of Clubs or Organizations: Yes    Attends Banker Meetings: 1 to 4 times per year    Marital Status: Never married   Past Medical History:  Past Medical History:  Diagnosis Date   Cholelithiasis    CKD (chronic kidney disease), stage II    Hernia of abdominal wall    Hypertension    Marijuana abuse     Past Surgical History:  Procedure Laterality Date   APPENDECTOMY     Family History:  Family History  Problem Relation Age of Onset   Depression Mother    Hypertension Father    Heart attack Father     Hospital Course:  Patient is a 68 year old female with history of schizoaffective disorder bipolar type admitted for change in mental health status, displaying odd behaviors, worsening insomnia, endorsing paranoia, in the context of running  out of her medication. Patient is currently admitted to general psychiatric unit for further stabilization. Patient is admitted to [facility] unit with Q15 min safety monitoring. Multidisciplinary team approach is offered. Medication management; group/milieu therapy is offered.  On admission on admission patient is restarted on her home medications of Zyprexa  15 mg nightly.  Patient tolerated the medication fairly well with no reported side effects.  She displayed safe behaviors on the unit.  She engaged in groups and treatment plan.  She denies having any side effects of EPS.  Throughout her admission she showed improvement in her mood,  improvement in hallucinations and denies suicidal/homicidal ideation.  Treatment team reached out to patient's daughter over the phone and updated her on the treatment plan and discharge planning.  On the day of discharge patient denies SI/HI/intent/plan and denies hallucinations.  Patient has a chronic flat affect but is engaging in the groups and is noted to be more visible on the unit.  She remains future oriented and is willing to participate in outpatient mental health services.  She does have an ACT team which was also confirmed by the daughter.  Social work team called multiple times to reach out to the ACT team with no response throughout this admission.  Physical Findings: AIMS:  , ,  ,  ,    CIWA:    COWS:        Psychiatric Specialty Exam:  Presentation  General Appearance:  Appropriate for Environment; Casual  Eye Contact: Fair  Speech: Clear and Coherent  Speech Volume: Normal    Mood and Affect  Mood: Euthymic  Affect: Appropriate   Thought Process  Thought Processes: Coherent  Descriptions of Associations:Intact  Orientation:Partial  Thought Content:Logical  Hallucinations:Hallucinations: None  Ideas of Reference:None  Suicidal Thoughts:Suicidal Thoughts: No  Homicidal Thoughts:Homicidal Thoughts: No   Sensorium  Memory: Immediate Fair; Recent Fair; Remote Poor  Judgment: Fair  Insight: Fair   Chartered certified accountant: Fair  Attention Span: Fair  Recall: [employer] of Knowledge: Fair  Language: Fair   Psychomotor Activity  Psychomotor Activity: Psychomotor Activity: Normal  Musculoskeletal: Strength & Muscle Tone: within normal limits Gait & Station: normal Assets  Assets: Manufacturing systems engineer; Desire for Improvement; Physical Health; Social Support   Sleep  Sleep: Sleep: Fair    Physical Exam: Physical Exam ROS Blood pressure 135/88, pulse (!) 103, temperature 98.2 F (36.8 C), resp.  rate 20, height 5\' 5"  (1.651 m), weight 91.2 kg, SpO2 99%. Body mass index is 33.45 kg/m.   Social History   Tobacco Use  Smoking Status Former   Types: Cigarettes  Smokeless Tobacco Never   Tobacco Cessation:  N/A, patient does not currently use tobacco products   Blood Alcohol level:  Lab Results  Component Value Date   [facility] <15 05/04/2024   ETH <10 08/06/2022    Metabolic Disorder Labs:  Lab Results  Component Value Date   HGBA1C 4.7 (L) 05/06/2024   MPG 88.19 05/06/2024   No results found for: "PROLACTIN" Lab Results  Component Value Date   CHOL 133 05/06/2024   TRIG 64 05/06/2024   HDL 54 05/06/2024   CHOLHDL 2.5 05/06/2024   VLDL 13 05/06/2024   LDLCALC 66 05/06/2024   LDLCALC 149 (H) 02/11/2023    See Psychiatric Specialty Exam and Suicide Risk Assessment completed by Attending Physician prior to discharge.  Discharge destination:  Home  Is patient on multiple antipsychotic therapies at discharge:  No   Has Patient had  three or more failed trials of antipsychotic monotherapy by history:  No  Recommended Plan for Multiple Antipsychotic Therapies: NA  Discharge Instructions     Diet - low sodium heart healthy   Complete by: As directed    Discharge instructions   Complete by: As directed    Recommend to have PCP send ina  referral for Outpatient Neurology for neurocognitive assessment and evaluate for any on setting Dementia   Increase activity slowly   Complete by: As directed       Allergies as of 05/11/2024       Reactions   Penicillins Swelling        Medication List     TAKE these medications      Indication  amLODipine  10 MG tablet Commonly known as: NORVASC  Take 1 tablet (10 mg total) by mouth daily. Start taking on: May 12, 2024 What changed: additional instructions    OLANZapine  15 MG tablet Commonly known as: ZYPREXA  Take 1 tablet (15 mg total) by mouth at bedtime.    rosuvastatin  40 MG tablet Commonly known as:  CRESTOR  Take 1 tablet (40 mg total) by mouth daily. Start taking on: May 12, 2024 What changed: additional instructions          Follow-up recommendations:  Activity:  As tolerated    Signed: Belal Scallon, MD 05/11/2024, 10:52 AM

## 2024-05-11 NOTE — Plan of Care (Signed)
   Problem: Education: Goal: Knowledge of [facility] Education information/materials will improve Outcome: Adequate for Discharge Goal: Emotional status will improve Outcome: Adequate for Discharge Goal: Mental status will improve Outcome: Adequate for Discharge Goal: Verbalization of understanding the information provided will improve Outcome: Adequate for Discharge   Problem: Activity: Goal: Interest or engagement in activities will improve Outcome: Adequate for Discharge Goal: Sleeping patterns will improve Outcome: Adequate for Discharge   Problem: Coping: Goal: Ability to verbalize frustrations and anger appropriately will improve Outcome: Adequate for Discharge Goal: Ability to demonstrate self-control will improve Outcome: Adequate for Discharge   Problem: Health Behavior/Discharge Planning: Goal: Identification of resources available to assist in meeting health care needs will improve Outcome: Adequate for Discharge Goal: Compliance with treatment plan for underlying cause of condition will improve Outcome: Adequate for Discharge   Problem: Physical Regulation: Goal: Ability to maintain clinical measurements within normal limits will improve Outcome: Adequate for Discharge   Problem: Safety: Goal: Periods of time without injury will increase Outcome: Adequate for Discharge   Problem: Education: Goal: Utilization of techniques to improve thought processes will improve Outcome: Adequate for Discharge Goal: Knowledge of the prescribed therapeutic regimen will improve Outcome: Adequate for Discharge   Problem: Activity: Goal: Interest or engagement in leisure activities will improve Outcome: Adequate for Discharge Goal: Imbalance in normal sleep/wake cycle will improve Outcome: Adequate for Discharge   Problem: Coping: Goal: Coping ability will improve Outcome: Adequate for Discharge Goal: Will verbalize feelings Outcome: Adequate for Discharge    Problem: Health Behavior/Discharge Planning: Goal: Ability to make decisions will improve Outcome: Adequate for Discharge Goal: Compliance with therapeutic regimen will improve Outcome: Adequate for Discharge   Problem: Role Relationship: Goal: Will demonstrate positive changes in social behaviors and relationships Outcome: Adequate for Discharge   Problem: Safety: Goal: Ability to disclose and discuss suicidal ideas will improve Outcome: Adequate for Discharge Goal: Ability to identify and utilize support systems that promote safety will improve Outcome: Adequate for Discharge   Problem: Self-Concept: Goal: Will verbalize positive feelings about self Outcome: Adequate for Discharge Goal: Level of anxiety will decrease Outcome: Adequate for Discharge

## 2024-06-09 ENCOUNTER — Other Ambulatory Visit (INDEPENDENT_AMBULATORY_CARE_PROVIDER_SITE_OTHER): Payer: Self-pay | Admitting: Primary Care

## 2024-06-09 NOTE — Telephone Encounter (Signed)
 Copied from CRM [id number]. Topic: Clinical - Medication Refill >> Jun 09, 2024  3:49 PM Tracey Ramos S wrote: Medication:  amLODipine  (NORVASC ) 10 MG tablet  OLANZapine  (ZYPREXA ) 15 MG tablet   rosuvastatin  (CRESTOR ) 40 MG tablet  Has the patient contacted their pharmacy? Yes (Agent: If no, request that the patient contact the pharmacy for the refill. If patient does not wish to contact the pharmacy document the reason why and proceed with request.) (Agent: If yes, when and what did the pharmacy advise?)  This is the patient's preferred pharmacy:  [facility] - [place], [place] - [address] [place] [address] Phone: [phone] Fax: [phone]  Is this the correct pharmacy for this prescription? Yes If no, delete pharmacy and type the correct one.   Has the prescription been filled recently? No  Is the patient out of the medication? Yes  Has the patient been seen for an appointment in the last year OR does the patient have an upcoming appointment? Yes  Can we respond through MyChart? Yes  Agent: Please be advised that Rx refills may take up to 3 business days. We ask that you follow-up with your pharmacy.

## 2024-06-12 NOTE — Telephone Encounter (Signed)
 Requested medications are due for refill today.  yes  Requested medications are on the active medications list.  yes  Last refill. 05/12/2024 #30 0 rf for all 3 meds  Future visit scheduled.   yes  Notes to clinic.  All 3 rx's signed by Dr. Jadapalle.    Requested Prescriptions  Pending Prescriptions Disp Refills   amLODipine  (NORVASC ) 10 MG tablet 30 tablet 0    Sig: Take 1 tablet (10 mg total) by mouth daily.     Cardiovascular: Calcium  Channel Blockers 2 Failed - 06/12/2024  4:35 PM      Failed - Valid encounter within last 6 months    Recent Outpatient Visits           1 year ago Cervical cancer screening   [place] [facility] Marius Siemens, NP   1 year ago Colon cancer screening   [place] [facility] Marius Siemens, NP   2 years ago Class 2 severe obesity due to excess calories with serious comorbidity in adult, unspecified BMI (HCC)   [place] [facility] Marius Siemens, NP   4 years ago Hypertension, unspecified type   [place] [facility] Marius Siemens, NP   4 years ago Poor dentition   [place] [facility] Marius Siemens, NP       Future Appointments             In 2 weeks Marius Siemens, NP [place] [facility]            Passed - Last BP in normal range    BP Readings from Last 1 Encounters:  05/10/24 134/88         Passed - Last Heart Rate in normal range    Pulse Readings from Last 1 Encounters:  05/10/24 (!) 108          OLANZapine  (ZYPREXA ) 15 MG tablet 30 tablet 0    Sig: Take 1 tablet (15 mg total) by mouth at bedtime.     Not Delegated - Psychiatry:  Antipsychotics - Second Generation (Atypical) - olanzapine  Failed - 06/12/2024  4:35 PM      Failed - This refill cannot be delegated      Failed - TSH in normal range and within 360 days    TSH  Date Value Ref Range Status  03/06/2018 1.326  0.350 - 4.500 uIU/mL Final    Comment:    Performed by a 3rd Generation assay with a functional sensitivity of <=0.01 uIU/mL. Performed at [facility], [address], [place], [place] [address]          Failed - Completed PHQ-2 or PHQ-9 in the last 360 days      Failed - Valid encounter within last 6 months    Recent Outpatient Visits           1 year ago Cervical cancer screening   [place] [facility] Marius Siemens, NP   1 year ago Colon cancer screening   [place] [facility] Marius Siemens, NP   2 years ago Class 2 severe obesity due to excess calories with serious comorbidity in adult, unspecified BMI (HCC)   [place] [facility] Marius Siemens, NP   4 years ago Hypertension, unspecified type   [place] [facility] Marius Siemens, NP   4 years ago Poor dentition  [place] [facility] Marius Siemens, NP       Future Appointments             In 2 weeks Marius Siemens, NP  [facility]            Failed - Lipid Panel in normal range within the last 12 months    Cholesterol, Total  Date Value Ref Range Status  02/11/2023 241 (H) 100 - 199 mg/dL Final   Cholesterol  Date Value Ref Range Status  05/06/2024 133 0 - 200 mg/dL Final   LDL Chol Calc (NIH)  Date Value Ref Range Status  02/11/2023 149 (H) 0 - 99 mg/dL Final   LDL Cholesterol  Date Value Ref Range Status  05/06/2024 66 0 - 99 mg/dL Final    Comment:           Total Cholesterol/HDL:CHD Risk Coronary Heart Disease Risk Table                     Men   Women  1/2 Average Risk   3.4   3.3  Average Risk       5.0   4.4  2 X Average Risk   9.6   7.1  3 X Average Risk  23.4   11.0        Use the calculated Patient Ratio above and the CHD Risk Table to determine the patient's CHD Risk.        ATP III CLASSIFICATION (LDL):  <100     mg/dL    Optimal  782-956  mg/dL   Near or Above                    Optimal  130-159  mg/dL   Borderline  213-086  mg/dL   High  >578     mg/dL   Very High Performed at [facility], [address] [place], [place] [address]    HDL  Date Value Ref Range Status  05/06/2024 54 >40 mg/dL Final  95/28/4132 64 >44 mg/dL Final   Triglycerides  Date Value Ref Range Status  05/06/2024 64 <150 mg/dL Final         Passed - Last BP in normal range    BP Readings from Last 1 Encounters:  05/10/24 134/88         Passed - Last Heart Rate in normal range    Pulse Readings from Last 1 Encounters:  05/10/24 (!) 108         Passed - CBC within normal limits and completed in the last 12 months    WBC  Date Value Ref Range Status  05/04/2024 7.1 4.0 - 10.5 K/uL Final   RBC  Date Value Ref Range Status  05/04/2024 4.12 3.87 - 5.11 MIL/uL Final   Hemoglobin  Date Value Ref Range Status  05/04/2024 12.8 12.0 - 15.0 g/dL Final  12/30/7251 66.4 11.1 - 15.9 g/dL Final   HCT  Date Value Ref Range Status  05/04/2024 39.2 36.0 - 46.0 % Final   Hematocrit  Date Value Ref Range Status  03/25/2018 40.8 34.0 - 46.6 % Final   MCHC  Date Value Ref Range Status  05/04/2024 32.7 30.0 - 36.0 g/dL Final   [facility]  Date Value Ref Range Status  05/04/2024 31.1 26.0 - 34.0 pg Final   MCV  Date Value Ref Range Status  05/04/2024 95.1 80.0 - 100.0 fL  Final  03/25/2018 95 79 - 97 fL Final   No results found for: PLTCOUNTKUC, LABPLAT, POCPLA RDW  Date Value Ref Range Status  05/04/2024 13.2 11.5 - 15.5 % Final  03/25/2018 12.9 12.3 - 15.4 % Final         Passed - CMP within normal limits and completed in the last 12 months    Albumin  Date Value Ref Range Status  05/04/2024 3.7 3.5 - 5.0 g/dL Final  08/65/7846 4.6 3.9 - 4.9 g/dL Final   Alkaline Phosphatase  Date Value Ref Range Status  05/04/2024 65 38 - 126 U/L Final   ALT  Date Value Ref Range Status  05/04/2024 17 0 -  44 U/L Final   AST  Date Value Ref Range Status  05/04/2024 28 15 - 41 U/L Final   BUN  Date Value Ref Range Status  05/04/2024 17 8 - 23 mg/dL Final  96/29/5284 12 8 - 27 mg/dL Final   Calcium   Date Value Ref Range Status  05/04/2024 9.1 8.9 - 10.3 mg/dL Final   CO2  Date Value Ref Range Status  05/04/2024 23 22 - 32 mmol/L Final   Creatinine, Ser  Date Value Ref Range Status  05/04/2024 1.10 (H) 0.44 - 1.00 mg/dL Final   Creatinine, Urine  Date Value Ref Range Status  03/07/2018 295.87 mg/dL Final    Comment:    Performed at [facility], [address], [place], [place] [address]   Glucose, Bld  Date Value Ref Range Status  05/04/2024 98 70 - 99 mg/dL Final    Comment:    Glucose reference range applies only to samples taken after fasting for at least 8 hours.   Glucose-Capillary  Date Value Ref Range Status  08/06/2022 91 70 - 99 mg/dL Final    Comment:    Glucose reference range applies only to samples taken after fasting for at least 8 hours.   Potassium  Date Value Ref Range Status  05/04/2024 3.7 3.5 - 5.1 mmol/L Final   Sodium  Date Value Ref Range Status  05/04/2024 138 135 - 145 mmol/L Final  02/11/2023 141 134 - 144 mmol/L Final   Total Bilirubin  Date Value Ref Range Status  05/04/2024 0.8 0.0 - 1.2 mg/dL Final   Bilirubin Total  Date Value Ref Range Status  02/11/2023 0.4 0.0 - 1.2 mg/dL Final   Protein, ur  Date Value Ref Range Status  05/04/2024 NEGATIVE NEGATIVE mg/dL Final   Total Protein  Date Value Ref Range Status  05/04/2024 7.2 6.5 - 8.1 g/dL Final  12/30/7251 7.6 6.0 - 8.5 g/dL Final   GFR calc Af Amer  Date Value Ref Range Status  03/25/2018 61 >59 mL/min/1.73 Final   eGFR  Date Value Ref Range Status  02/11/2023 63 >59 mL/min/1.73 Final   GFR, Estimated  Date Value Ref Range Status  05/04/2024 55 (L) >60 mL/min Final    Comment:    (NOTE) Calculated using the CKD-EPI Creatinine Equation (2021)            rosuvastatin  (CRESTOR ) 40 MG tablet 30 tablet 0    Sig: Take 1 tablet (40 mg total) by mouth daily.     Cardiovascular:  Antilipid - Statins 2 Failed - 06/12/2024  4:35 PM      Failed - Cr in normal range and within 360 days    Creatinine, Ser  Date Value Ref Range Status  05/04/2024 1.10 (H) 0.44 - 1.00 mg/dL  Final   Creatinine, Urine  Date Value Ref Range Status  03/07/2018 295.87 mg/dL Final    Comment:    Performed at [facility], [address] [place], [place] [address]         Failed - Valid encounter within last 12 months    Recent Outpatient Visits           1 year ago Cervical cancer screening   [place] [facility] Marius Siemens, NP   1 year ago Colon cancer screening   [place] [facility] Marius Siemens, NP   2 years ago Class 2 severe obesity due to excess calories with serious comorbidity in adult, unspecified BMI (HCC)   [place] [facility] Marius Siemens, NP   4 years ago Hypertension, unspecified type   [place] [facility] Marius Siemens, NP   4 years ago Poor dentition   [place] [facility] Marius Siemens, NP       Future Appointments             In 2 weeks Marius Siemens, NP [place] [facility]            Failed - Lipid Panel in normal range within the last 12 months    Cholesterol, Total  Date Value Ref Range Status  02/11/2023 241 (H) 100 - 199 mg/dL Final   Cholesterol  Date Value Ref Range Status  05/06/2024 133 0 - 200 mg/dL Final   LDL Chol Calc (NIH)  Date Value Ref Range Status  02/11/2023 149 (H) 0 - 99 mg/dL Final   LDL Cholesterol  Date Value Ref Range Status  05/06/2024 66 0 - 99 mg/dL Final    Comment:           Total Cholesterol/HDL:CHD Risk Coronary Heart Disease Risk Table                     Men   Women  1/2 Average Risk   3.4   3.3  Average Risk       5.0    4.4  2 X Average Risk   9.6   7.1  3 X Average Risk  23.4   11.0        Use the calculated Patient Ratio above and the CHD Risk Table to determine the patient's CHD Risk.        ATP III CLASSIFICATION (LDL):  <100     mg/dL   Optimal  191-478  mg/dL   Near or Above                    Optimal  130-159  mg/dL   Borderline  295-621  mg/dL   High  >308     mg/dL   Very High Performed at [facility], [address] [place], [place] [address]    HDL  Date Value Ref Range Status  05/06/2024 54 >40 mg/dL Final  69/62/9528 64 >41 mg/dL Final   Triglycerides  Date Value Ref Range Status  05/06/2024 64 <150 mg/dL Final         Passed - Patient is not pregnant

## 2024-06-12 NOTE — Telephone Encounter (Signed)
 Will forward to provider

## 2024-06-22 NOTE — Telephone Encounter (Signed)
Pt is over due for an appt.  

## 2024-06-27 ENCOUNTER — Encounter (INDEPENDENT_AMBULATORY_CARE_PROVIDER_SITE_OTHER): Admitting: Primary Care

## 2024-08-07 ENCOUNTER — Encounter (INDEPENDENT_AMBULATORY_CARE_PROVIDER_SITE_OTHER): Payer: Self-pay | Admitting: Primary Care

## 2024-08-07 ENCOUNTER — Ambulatory Visit (INDEPENDENT_AMBULATORY_CARE_PROVIDER_SITE_OTHER): Admitting: Primary Care

## 2024-08-07 VITALS — BP 112/79 | HR 82 | Wt 193.4 lb

## 2024-08-07 DIAGNOSIS — Z0000 Encounter for general adult medical examination without abnormal findings: Secondary | ICD-10-CM | POA: Diagnosis not present

## 2024-08-07 DIAGNOSIS — E2839 Other primary ovarian failure: Secondary | ICD-10-CM

## 2024-08-07 NOTE — Progress Notes (Signed)
 Complete physical exam  Patient: Tracey Ramos   DOB: February 02, 1956   68 y.o. Female  MRN: [id number]  Subjective:    Chief Complaint  Patient presents with   Annual Exam   Medication Refill    Tracey Ramos is a 68 y.o. female who presents today for a complete physical exam.  She presents of her granddaughter Tracey Ramos and provide information.  Patient has been in [place]  for the last 2 months without medications blood pressure medication.  Today blood pressure is unremarkable 112/79.  She is not a diabetic A1c 4.7 and lipid panel last done was normal these readings are without medication.  We did discuss extracurricular activities that she does not need to be involved in.  She has diagnoses bipolar 1 disorder, severe depression currently on Zyprexa  and she has appointment today with the ACT team to start back therapy and medication.   Most recent fall risk assessment:    08/07/2024   11:20 AM  Fall Risk   Falls in the past year? 0  Number falls in past yr: 0  Injury with Fall? 0  Risk for fall due to : No Fall Risks  Follow up Falls evaluation completed     Most recent depression screenings:    08/07/2024   11:22 AM 04/19/2023   11:58 AM  PHQ 2/9 Scores  PHQ - 2 Score 3 1  PHQ- 9 Score 9     Vision:Not within last year  vision changes   Family Status  Relation Name Status   Mother  Deceased   Father  Deceased  No partnership data on file      Patient Care Team: Celestia Rosaline SQUIBB, NP as PCP - General (Internal Medicine)   Outpatient Medications Prior to Visit  Medication Sig   amLODipine  (NORVASC ) 10 MG tablet Take 1 tablet (10 mg total) by mouth daily.   OLANZapine  (ZYPREXA ) 15 MG tablet Take 1 tablet (15 mg total) by mouth at bedtime.   rosuvastatin  (CRESTOR ) 40 MG tablet Take 1 tablet (40 mg total) by mouth daily.   No facility-administered medications prior to visit.    Review of Systems  Eyes:  Positive for blurred vision.  All other systems reviewed  and are negative.         Objective:   BP 112/79 (BP Location: Right Arm, Patient Position: Sitting, Cuff Size: Normal)   Pulse 82   Wt 193 lb 6.4 oz (87.7 kg)   SpO2 100%   BMI 32.18 kg/m  BP Readings from Last 3 Encounters:  08/07/24 112/79  05/11/24 135/88  05/10/24 134/88   Vitals:   08/07/24 1121  BP: 112/79  Pulse: 82  SpO2: 100%  Weight: 193 lb 6.4 oz (87.7 kg)   Physical Exam  General: Vital signs reviewed.  Patient is well-developed and well-nourished, obese female  in no acute distress and cooperative with exam.  Head: Normocephalic and atraumatic. Eyes: EOMI, conjunctivae normal, no scleral icterus.  Neck: Supple, trachea midline, normal ROM, no JVD, masses, thyromegaly, or carotid  bruit present.  Cardiovascular: RRR, S1 normal, S2 normal, no murmurs, gallops, or rubs. Pulmonary/Chest: Clear to auscultation bilaterally, no wheezes, rales, or rhonchi. Abdominal: Soft, non-tender, non-distended, BS +, no masses, organomegaly, or guarding present.  Musculoskeletal: No joint deformities, erythema, or stiffness, ROM full and nontender. Extremities: No lower extremity edema bilaterally,  pulses symmetric and intact bilaterally. No cyanosis or clubbing. Neurological: A&O x3, Strength is normal and symmetric bilaterally, cranial nerve II-XII are  grossly intact, no focal motor deficit, sensory intact to light touch bilaterally.  Skin: Warm, dry and intact. No rashes or erythema. Psychiatric: Normal mood and affect. speech and behavior is normal. Cognition and memory are normal.    Assessment & Plan:    Routine Health Maintenance and Physical Exam  Immunization History  Administered Date(s) Administered   Tdap 04/26/2018    Health Maintenance  Topic Date Due   Medicare Annual Wellness (AWV)  Never done   Pneumococcal Vaccine: 50+ Years (1 of 1 - PCV) Never done   Zoster Vaccines- Shingrix (1 of 2) Never done   DEXA SCAN  Never done   COVID-19 Vaccine (1 -  2024-25 season) Never done   INFLUENZA VACCINE  07/28/2024   MAMMOGRAM  03/08/2025   Fecal DNA (Cologuard)  02/24/2026   DTaP/Tdap/Td (2 - Td or Tdap) 04/26/2028   Hepatitis C Screening  Completed   Hepatitis B Vaccines  Aged Out   HPV VACCINES  Aged Out   Meningococcal B Vaccine  Aged Out    Discussed health benefits of physical activity, and encouraged her to engage in regular exercise appropriate for her age and condition.  Kerensa was seen today for annual exam and medication refill.  Diagnoses and all orders for this visit:  Annual physical exam  Estrogen deficiency HM DEXA SCAN      Rosaline SHAUNNA Bohr, NP

## 2024-08-14 ENCOUNTER — Emergency Department (HOSPITAL_COMMUNITY)
Admission: EM | Admit: 2024-08-14 | Discharge: 2024-08-15 | Disposition: A | Discharge status: Home / Self Care | Attending: Emergency Medicine | Admitting: Emergency Medicine

## 2024-08-14 ENCOUNTER — Encounter (HOSPITAL_COMMUNITY): Payer: Self-pay | Admitting: *Deleted

## 2024-08-14 ENCOUNTER — Other Ambulatory Visit: Payer: Self-pay

## 2024-08-14 DIAGNOSIS — R55 Syncope and collapse: Secondary | ICD-10-CM | POA: Insufficient documentation

## 2024-08-14 DIAGNOSIS — N189 Chronic kidney disease, unspecified: Secondary | ICD-10-CM | POA: Insufficient documentation

## 2024-08-14 DIAGNOSIS — Z79899 Other long term (current) drug therapy: Secondary | ICD-10-CM | POA: Diagnosis not present

## 2024-08-14 DIAGNOSIS — E876 Hypokalemia: Secondary | ICD-10-CM | POA: Diagnosis not present

## 2024-08-14 DIAGNOSIS — I129 Hypertensive chronic kidney disease with stage 1 through stage 4 chronic kidney disease, or unspecified chronic kidney disease: Secondary | ICD-10-CM | POA: Diagnosis not present

## 2024-08-14 LAB — COMPREHENSIVE METABOLIC PANEL WITH GFR
ALT: 14 U/L (ref 0–44)
AST: 25 U/L (ref 15–41)
Albumin: 4.2 g/dL (ref 3.5–5.0)
Alkaline Phosphatase: 68 U/L (ref 38–126)
Anion gap: 14 (ref 5–15)
BUN: 13 mg/dL (ref 8–23)
CO2: 23 mmol/L (ref 22–32)
Calcium: 9.5 mg/dL (ref 8.9–10.3)
Chloride: 106 mmol/L (ref 98–111)
Creatinine, Ser: 1.25 mg/dL — ABNORMAL HIGH (ref 0.44–1.00)
GFR, Estimated: 47 mL/min — ABNORMAL LOW (ref >60)
Glucose, Bld: 110 mg/dL — ABNORMAL HIGH (ref 70–99)
Potassium: 3.3 mmol/L — ABNORMAL LOW (ref 3.5–5.1)
Sodium: 143 mmol/L (ref 135–145)
Total Bilirubin: 0.9 mg/dL (ref 0.0–1.2)
Total Protein: 8.2 g/dL — ABNORMAL HIGH (ref 6.5–8.1)

## 2024-08-14 LAB — CBC
HCT: 42 % (ref 36.0–46.0)
Hemoglobin: 12.9 g/dL (ref 12.0–15.0)
MCH: 29.7 pg (ref 26.0–34.0)
MCHC: 30.7 g/dL (ref 30.0–36.0)
MCV: 96.8 fL (ref 80.0–100.0)
Platelets: 250 K/uL (ref 150–400)
RBC: 4.34 MIL/uL (ref 3.87–5.11)
RDW: 13.2 % (ref 11.5–15.5)
WBC: 8.8 K/uL (ref 4.0–10.5)
nRBC: 0 % (ref 0.0–0.2)

## 2024-08-14 NOTE — ED Triage Notes (Signed)
 Pt says that she was feeling dizzy while cooking tonight and started falling, her granddaughter helped her to the ground Pt denies LOC and denies hitting her head. Pt is alert/oriented, denies weakness, mae x 4, has no current complaints.

## 2024-08-14 NOTE — ED Triage Notes (Signed)
 Pt arrives from home via [facility], per EMS report, the pt was in the  kitchen cooking today and started to fall to the floor, her granddaughter helped her to the ground, granddaughter reported unresponsive for 2 minutes and was shaking. EMS got her up to the chair and her BP was 130/90, + orthostatics.....stood up, bp dropped to the 90's. Hr 130's, cbg 130. 98% RA. She is alert and oriented and remembers the event.

## 2024-08-15 ENCOUNTER — Encounter (HOSPITAL_COMMUNITY): Payer: Self-pay

## 2024-08-15 ENCOUNTER — Emergency Department (HOSPITAL_COMMUNITY)

## 2024-08-15 DIAGNOSIS — R55 Syncope and collapse: Secondary | ICD-10-CM | POA: Diagnosis not present

## 2024-08-15 LAB — URINALYSIS, ROUTINE W REFLEX MICROSCOPIC
Bilirubin Urine: NEGATIVE
Glucose, UA: NEGATIVE mg/dL
Hgb urine dipstick: NEGATIVE
Leukocytes,Ua: NEGATIVE
Nitrite: POSITIVE — AB
Protein, ur: NEGATIVE mg/dL
Specific Gravity, Urine: 1.025 (ref 1.005–1.030)
pH: 6 (ref 5.0–8.0)

## 2024-08-15 LAB — TROPONIN I (HIGH SENSITIVITY)
Troponin I (High Sensitivity): 5 ng/L (ref <18)
Troponin I (High Sensitivity): 6 ng/L

## 2024-08-15 LAB — MAGNESIUM: Magnesium: 2.3 mg/dL (ref 1.7–2.4)

## 2024-08-15 MED ORDER — LACTATED RINGERS IV BOLUS
1000.0000 mL | Freq: Once | INTRAVENOUS | Status: AC
  Administered 2024-08-15: 1000 mL via INTRAVENOUS

## 2024-08-15 MED ORDER — IOHEXOL 350 MG/ML SOLN
75.0000 mL | Freq: Once | INTRAVENOUS | Status: AC | PRN
  Administered 2024-08-15: 75 mL via INTRAVENOUS

## 2024-08-15 MED ORDER — SODIUM CHLORIDE 0.9 % IV SOLN
1.0000 g | Freq: Once | INTRAVENOUS | Status: AC
  Administered 2024-08-15: 1 g via INTRAVENOUS
  Filled 2024-08-15: qty 10

## 2024-08-15 MED ORDER — CEPHALEXIN 500 MG PO CAPS: 500.0000 mg | ORAL_CAPSULE | Freq: Four times a day (QID) | ORAL | 0 refills | Status: DC

## 2024-08-15 MED ORDER — LACTATED RINGERS IV BOLUS: 500.0000 mL | Freq: Once | INTRAVENOUS | Status: DC

## 2024-08-15 MED ORDER — POTASSIUM CHLORIDE CRYS ER 20 MEQ PO TBCR
40.0000 meq | EXTENDED_RELEASE_TABLET | Freq: Once | ORAL | Status: AC
  Administered 2024-08-15: 40 meq via ORAL
  Filled 2024-08-15: qty 2

## 2024-08-15 NOTE — Discharge Instructions (Addendum)
 Return for any problem.  ?

## 2024-08-15 NOTE — ED Provider Notes (Signed)
 [place] EMERGENCY DEPARTMENT AT [facility] Provider Note   CSN: [id number] Arrival date & time: 08/14/24  2235     Patient presents with: Near Syncope   Tracey Ramos is a 68 y.o. female.    Near Syncope  Patient presents for loss of consciousness.  Medical history includes HTN, CKD, bipolar disorder.  Earlier tonight, patient had a syncopal episode witnessed by her granddaughter.  She had a prodrome of dizziness.  Granddaughter noticed that she was having some jerking movements.  Granddaughter has seen this before in the setting of syncope.  She was helped to the ground by her granddaughter.  Granddaughter estimates that she lost consciousness while on the ground for approximately 15 seconds.  She returned to mental baseline upon awakening.  Patient denies any complaints at this time.  Granddaughter noted that the patient had been out of town over the weekend with her daughter and were smoking marijuana.  Marijuana use has led to syncopal episodes before. EMS noted orthostatic hypotension.     Prior to Admission medications   Medication Sig Start Date End Date Taking? Authorizing Provider  amLODipine  (NORVASC ) 10 MG tablet Take 1 tablet (10 mg total) by mouth daily. 05/12/24   Donnelly Mellow, MD  OLANZapine  (ZYPREXA ) 15 MG tablet Take 1 tablet (15 mg total) by mouth at bedtime. 05/11/24   Donnelly Mellow, MD  rosuvastatin  (CRESTOR ) 40 MG tablet Take 1 tablet (40 mg total) by mouth daily. 05/12/24   Donnelly Mellow, MD    Allergies: Penicillins    Review of Systems  Cardiovascular:  Positive for near-syncope.  Neurological:  Positive for dizziness and syncope.  All other systems reviewed and are negative.   Updated Vital Signs BP (!) 146/91   Pulse 81   Temp 98.2 F (36.8 C) (Oral)   Resp 13   Ht 5' 5 (1.651 m)   Wt 87.5 kg   SpO2 99%   BMI 32.12 kg/m   Physical Exam Vitals and nursing note reviewed.  Constitutional:      General: She is not in acute  distress.    Appearance: Normal appearance. She is well-developed. She is not ill-appearing, toxic-appearing or diaphoretic.  HENT:     Head: Normocephalic and atraumatic.     Right Ear: External ear normal.     Left Ear: External ear normal.     Nose: Nose normal.     Mouth/Throat:     Mouth: Mucous membranes are moist.  Eyes:     Extraocular Movements: Extraocular movements intact.     Conjunctiva/sclera: Conjunctivae normal.  Cardiovascular:     Rate and Rhythm: Normal rate and regular rhythm.  Pulmonary:     Effort: Pulmonary effort is normal. No respiratory distress.  Abdominal:     General: There is no distension.     Palpations: Abdomen is soft.     Tenderness: There is no abdominal tenderness.  Musculoskeletal:        General: No swelling.     Cervical back: Normal range of motion and neck supple.  Skin:    General: Skin is warm and dry.     Coloration: Skin is not jaundiced or pale.  Neurological:     General: No focal deficit present.     Mental Status: She is alert and oriented to person, place, and time.     Cranial Nerves: No cranial nerve deficit.     Sensory: No sensory deficit.     Motor: No weakness.  Coordination: Coordination normal.  Psychiatric:        Mood and Affect: Mood normal.        Behavior: Behavior normal.     (all labs ordered are listed, but only abnormal results are displayed) Labs Reviewed  COMPREHENSIVE METABOLIC PANEL WITH GFR - Abnormal; Notable for the following components:      Result Value   Potassium 3.3 (*)    Glucose, Bld 110 (*)    Creatinine, Ser 1.25 (*)    Total Protein 8.2 (*)    GFR, Estimated 47 (*)    All other components within normal limits  URINALYSIS, ROUTINE W REFLEX MICROSCOPIC - Abnormal; Notable for the following components:   Ketones, ur TRACE (*)    Nitrite POSITIVE (*)    Bacteria, UA RARE (*)    All other components within normal limits  CBC  MAGNESIUM   TROPONIN I (HIGH SENSITIVITY)  TROPONIN I  (HIGH SENSITIVITY)    EKG: EKG Interpretation Date/Time:  Monday August 14 2024 22:46:45 EDT Ventricular Rate:  116 PR Interval:  146 QRS Duration:  79 QT Interval:  332 QTC Calculation: 462 R Axis:   -16  Text Interpretation: Sinus tachycardia Atrial premature complexes Borderline left axis deviation Consider anterior infarct Confirmed by Melvenia Motto ([id number]) on 08/15/2024 4:01:43 AM  Radiology: CT Angio Chest PE W and/or Wo Contrast Result Date: 08/15/2024 CLINICAL DATA:  Hypotension. Clinical concern for pulmonary embolism. EXAM: CT ANGIOGRAPHY CHEST WITH CONTRAST TECHNIQUE: Multidetector CT imaging of the chest was performed using the standard protocol during bolus administration of intravenous contrast. Multiplanar CT image reconstructions and MIPs were obtained to evaluate the vascular anatomy. RADIATION DOSE REDUCTION: This exam was performed according to the departmental dose-optimization program which includes automated exposure control, adjustment of the mA and/or kV according to patient size and/or use of iterative reconstruction technique. CONTRAST:  75mL OMNIPAQUE  IOHEXOL  350 MG/ML SOLN COMPARISON:  10/10/2021 FINDINGS: Cardiovascular: The heart size is normal. No substantial pericardial effusion. No thoracic aortic aneurysm. No substantial atherosclerotic calcification of the thoracic aorta. There is no filling defect within the opacified pulmonary arteries to suggest the presence of an acute pulmonary embolus. Mediastinum/Nodes: No mediastinal lymphadenopathy. There is no hilar lymphadenopathy. Moderate hiatal hernia. The esophagus has normal imaging features. There is no axillary lymphadenopathy. Lungs/Pleura: 5-6 mm posterior right lower lobe pulmonary nodule on 69/10 is stable. Nearly 3 years of imaging stability is consistent with benign etiology. No followup imaging is recommended. Scattered areas of subsegmental atelectasis noted in both lung bases. No dense consolidative airspace  disease. No pleural effusion. Upper Abdomen: Calcified gallstones evident. Fat containing lesion upper pole left kidney has been present since CT scan of 09/14/2012 consistent with benign etiology such as angiomyolipoma. Musculoskeletal: No worrisome lytic or sclerotic osseous abnormality. Review of the MIP images confirms the above findings. IMPRESSION: 1. No CT evidence for acute pulmonary embolus. 2. Cholelithiasis. 3. Moderate hiatal hernia. Electronically Signed   By: Camellia Candle M.D.   On: 08/15/2024 07:50   CT Head Wo Contrast Result Date: 08/15/2024 CLINICAL DATA:  Syncope. EXAM: CT HEAD WITHOUT CONTRAST TECHNIQUE: Contiguous axial images were obtained from the base of the skull through the vertex without intravenous contrast. RADIATION DOSE REDUCTION: This exam was performed according to the departmental dose-optimization program which includes automated exposure control, adjustment of the mA and/or kV according to patient size and/or use of iterative reconstruction technique. COMPARISON:  05/04/2024 FINDINGS: Brain: There is no evidence for acute hemorrhage, hydrocephalus, mass lesion,  or abnormal extra-axial fluid collection. No definite CT evidence for acute infarction. Diffuse loss of parenchymal volume is consistent with atrophy. Patchy low attenuation in the deep hemispheric and periventricular white matter is nonspecific, but likely reflects chronic microvascular ischemic demyelination. Vascular: No hyperdense vessel or unexpected calcification. Skull: No evidence for fracture. No worrisome lytic or sclerotic lesion. Sinuses/Orbits: The visualized paranasal sinuses and mastoid air cells are clear. Visualized portions of the globes and intraorbital fat are unremarkable. Other: None. IMPRESSION: 1. No acute intracranial abnormality. 2. Atrophy with chronic small vessel ischemic disease. Electronically Signed   By: Camellia Candle M.D.   On: 08/15/2024 07:44     Procedures   Medications Ordered  in the ED  lactated ringers  bolus 1,000 mL (has no administration in time range)  lactated ringers  bolus 1,000 mL (0 mLs Intravenous Stopped 08/15/24 0619)  cefTRIAXone  (ROCEPHIN ) 1 g in sodium chloride  0.9 % 100 mL IVPB (0 g Intravenous Stopped 08/15/24 0444)  potassium chloride  SA (KLOR-CON  M) CR tablet 40 mEq (40 mEq Oral Given 08/15/24 0629)  iohexol  (OMNIPAQUE ) 350 MG/ML injection 75 mL (75 mLs Intravenous Contrast Given 08/15/24 0721)                                    Medical Decision Making Amount and/or Complexity of Data Reviewed Labs: ordered. Radiology: ordered.  Risk Prescription drug management.   This patient presents to the ED for concern of syncope, this involves an extensive number of treatment options, and is a complaint that carries with it a high risk of complications and morbidity.  The differential diagnosis includes vasovagal episode, dehydration, anemia, arrhythmia, PE, seizure, TIA   Co morbidities / Chronic conditions that complicate the patient evaluation  HTN, CKD, bipolar disorder   Additional history obtained:  Additional history obtained from EMR External records from outside source obtained and reviewed including patient's granddaughter   Lab Tests:  I Ordered, and personally interpreted labs.  The pertinent results include: Normal hemoglobin, no leukocytosis, normal troponin, baseline creatinine, slight hypokalemia with otherwise normal electrolytes.  Urinalysis shows evidence of infection.   Imaging Studies ordered:  I ordered imaging studies including CT head, CTA chest I independently visualized and interpreted imaging which showed (pending at time of signout) I agree with the radiologist interpretation   Cardiac Monitoring: / EKG:  The patient was maintained on a cardiac monitor.  I personally viewed and interpreted the cardiac monitored which showed an underlying rhythm of: Sinus rhythm   Problem List / ED Course / Critical  interventions / Medication management  Patient presenting after with a syncopal episode.  Estimated duration of LOC was 15 seconds.  She had quick return to mental baseline.  Granddaughter describes some twitching movements but not consistent with seizure.  On arrival in the ED, patient is alert and oriented.  She denies any physical complaints at this time.  She has no focal neurologic deficits.  Vital signs are notable for tachycardia.  Of note, EMS also noted orthostatic hypotension.  IV fluids were ordered for hydration.  Workup was initiated.  Urinalysis shows nitrate positive bacteria.  Rocephin  was ordered for treatment of UTI.  After IV fluids, patient was stood up.  Although her blood pressure did not drop, she did get tachycardic in the range of 140.  Additional IV fluids were ordered.  Patient went CT of head and CTA of chest.  Results pending at  time of signout.  Care of patient was signed out to oncoming ED provider. I ordered medication including IV fluids for hydration, potassium chloride  for hypokalemia, ceftriaxone  for UTI Reevaluation of the patient after these medicines showed that the patient improved I have reviewed the patients home medicines and have made adjustments as needed  Social Determinants of Health:  Lives at home with family     Final diagnoses:  Syncope and collapse    ED Discharge Orders     None          Melvenia Motto, MD 08/15/24 [phone]

## 2024-08-15 NOTE — ED Notes (Signed)
 Pt ambulated to restroom and back. Steady gait. No distress noted.

## 2024-08-15 NOTE — ED Notes (Signed)
 LYING:  BP 137/72 HR 102   SITTING:  BP 151/108 HR 130  STANDING:  BP 164/114 HR 140  ASYMPTOMATIC

## 2024-08-15 NOTE — ED Notes (Signed)
 LR will run after Rocephin  is completed  to not run together.

## 2024-08-15 NOTE — ED Provider Notes (Signed)
 Patient seen after prior EDP.  Patient feels much improved on reevaluation.  Patient desires discharge.  She declines additional intervention, IV fluids, admission.  Patient advised that the safest and most conservative course of action would be to admit her for further workup and observation.  Patient refuses this.  Patient has capacity to refuse care.  Patient understands need for close outpatient follow-up with her PCP.  Family present at bedside during disposition and discussion.  Importance of close follow-up is stressed.  Strict return precautions given and understood.   Laurice Maude BROCKS, MD 08/15/24 1116

## 2024-10-09 ENCOUNTER — Ambulatory Visit (INDEPENDENT_AMBULATORY_CARE_PROVIDER_SITE_OTHER): Admitting: Primary Care

## 2024-10-11 ENCOUNTER — Encounter (INDEPENDENT_AMBULATORY_CARE_PROVIDER_SITE_OTHER): Payer: Self-pay | Admitting: Primary Care

## 2024-10-11 ENCOUNTER — Ambulatory Visit (INDEPENDENT_AMBULATORY_CARE_PROVIDER_SITE_OTHER): Admitting: Primary Care

## 2024-10-11 VITALS — BP 136/82 | HR 90 | Resp 16 | Wt 204.2 lb

## 2024-10-11 DIAGNOSIS — I10 Essential (primary) hypertension: Secondary | ICD-10-CM

## 2024-10-11 MED ORDER — AMLODIPINE BESYLATE 10 MG PO TABS: 10.0000 mg | ORAL_TABLET | Freq: Every day | ORAL | 1 refills | Status: AC

## 2024-10-11 NOTE — Progress Notes (Signed)
 [facility]   Tracey Ramos is a 68 y.o. female presents for hypertension evaluation, Denies shortness of breath, headaches, chest pain or lower extremity edema, sudden onset, vision changes, unilateral weakness, dizziness, paresthesias   Patient reports adherence with medications.   Past Medical History:  Diagnosis Date   Cholelithiasis    CKD (chronic kidney disease), stage II    Hernia of abdominal wall    Hypertension    Marijuana abuse    Past Surgical History:  Procedure Laterality Date   APPENDECTOMY     Allergies  Allergen Reactions   Penicillins Swelling   Current Outpatient Medications on File Prior to Visit  Medication Sig Dispense Refill   amLODipine  (NORVASC ) 10 MG tablet Take 1 tablet (10 mg total) by mouth daily. 30 tablet 0   cephALEXin  (KEFLEX ) 500 MG capsule Take 1 capsule (500 mg total) by mouth 4 (four) times daily. 28 capsule 0   OLANZapine  (ZYPREXA ) 15 MG tablet Take 1 tablet (15 mg total) by mouth at bedtime. 30 tablet 0   rosuvastatin  (CRESTOR ) 40 MG tablet Take 1 tablet (40 mg total) by mouth daily. 30 tablet 0   No current facility-administered medications on file prior to visit.   Social History   Socioeconomic History   Marital status: Single    Spouse name: Not on file   Number of children: Not on file   Years of education: Not on file   Highest education level: Not on file  Occupational History   Not on file  Tobacco Use   Smoking status: Former    Types: Cigarettes   Smokeless tobacco: Never  Vaping Use   Vaping status: Never Used  Substance and Sexual Activity   Alcohol use: Yes   Drug use: Not Currently    Types: Marijuana   Sexual activity: Not Currently  Other Topics Concern   Not on file  Social History Narrative   Not on file   Social Drivers of Health   Financial Resource Strain: Not on file  Food Insecurity: No Food Insecurity (05/05/2024)   Hunger Vital Sign    Worried About Running Out of Food  in the Last Year: Never true    Ran Out of Food in the Last Year: Never true  Transportation Needs: No Transportation Needs (05/05/2024)   PRAPARE - Administrator, Civil Service (Medical): No    Lack of Transportation (Non-Medical): No  Physical Activity: Not on file  Stress: Not on file  Social Connections: Moderately Integrated (05/05/2024)   Social Connection and Isolation Panel    Frequency of Communication with Friends and Family: Twice a week    Frequency of Social Gatherings with Friends and Family: Three times a week    Attends Religious Services: 1 to 4 times per year    Active Member of Clubs or Organizations: Yes    Attends Banker Meetings: 1 to 4 times per year    Marital Status: Never married  Intimate Partner Violence: Not At Risk (05/05/2024)   Humiliation, Afraid, Rape, and Kick questionnaire    Fear of Current or Ex-Partner: No    Emotionally Abused: No    Physically Abused: No    Sexually Abused: No   Family History  Problem Relation Age of Onset   Depression Mother    Hypertension Father    Heart attack Father    Health Maintenance  Topic Date Due   Medicare Annual Wellness (AWV)  Never done  COVID-19 Vaccine (1 - 2025-26 season) Never done   Zoster Vaccines- Shingrix (1 of 2) 01/11/2025 (Originally 04/02/2006)   Influenza Vaccine  03/27/2025 (Originally 07/28/2024)   Pneumococcal Vaccine: 50+ Years (1 of 1 - PCV) 10/11/2025 (Originally 04/02/2006)   Mammogram  03/08/2025   Fecal DNA (Cologuard)  02/24/2026   DTaP/Tdap/Td (2 - Td or Tdap) 04/26/2028   DEXA SCAN  Completed   Hepatitis C Screening  Completed   Meningococcal B Vaccine  Aged Out     OBJECTIVE:  Vitals:   10/11/24 1016 10/11/24 1017  BP: (!) 170/115 (!) 177/100  Pulse: 90   Resp: 16   SpO2: 98%   Weight: 204 lb 3.2 oz (92.6 kg)     Physical Exam Vitals reviewed.  Constitutional:      Appearance: Normal appearance.  HENT:     Head: Normocephalic.     Right Ear:  Tympanic membrane, ear canal and external ear normal.     Left Ear: Tympanic membrane, ear canal and external ear normal.     Nose: Nose normal.     Mouth/Throat:     Mouth: Mucous membranes are moist.  Eyes:     Extraocular Movements: Extraocular movements intact.     Pupils: Pupils are equal, round, and reactive to light.  Cardiovascular:     Rate and Rhythm: Normal rate.  Pulmonary:     Effort: Pulmonary effort is normal.     Breath sounds: Normal breath sounds.  Abdominal:     General: Bowel sounds are normal.     Palpations: Abdomen is soft.  Musculoskeletal:        General: Normal range of motion.     Cervical back: Normal range of motion.  Skin:    General: Skin is warm and dry.  Neurological:     Mental Status: She is alert and oriented to person, place, and time.  Psychiatric:        Mood and Affect: Mood normal.        Behavior: Behavior normal.        Thought Content: Thought content normal.      ROS  Last 3 Office BP readings: BP Readings from Last 3 Encounters:  10/11/24 (!) 177/100  08/15/24 135/89  08/07/24 112/79    BMET    Component Value Date/Time   NA 143 08/14/2024 2305   NA 141 02/11/2023 1152   K 3.3 (L) 08/14/2024 2305   CL 106 08/14/2024 2305   CO2 23 08/14/2024 2305   GLUCOSE 110 (H) 08/14/2024 2305   BUN 13 08/14/2024 2305   BUN 12 02/11/2023 1152   CREATININE 1.25 (H) 08/14/2024 2305   CALCIUM  9.5 08/14/2024 2305   GFRNONAA 47 (L) 08/14/2024 2305   GFRAA 61 03/25/2018 1433    Renal function: CrCl cannot be calculated (Patient's most recent lab result is older than the maximum 21 days allowed.).  Clinical ASCVD: Yes  The 10-year ASCVD risk score (Arnett DK, et al., 2019) is: 14.5%   Values used to calculate the score:     Age: 64 years     Clincally relevant sex: Female     Is Non-Hispanic African American: Yes     Diabetic: No     Tobacco smoker: No     Systolic Blood Pressure: 177 mmHg     Is BP treated: Yes     HDL  Cholesterol: 54 mg/dL     Total Cholesterol: 133 mg/dL  ASCVD risk factors include- [place]  ASSESSMENT & PLAN: Tracey Ramos was seen today for hypertension.  Diagnoses and all orders for this visit:  Primary hypertension -     amLODipine  (NORVASC ) 10 MG tablet; Take 1 tablet (10 mg total) by mouth daily.   -Counseled on lifestyle modifications for blood pressure control including reduced dietary sodium, increased exercise, weight reduction and adequate sleep. Also, educated patient about the risk for cardiovascular events, stroke and heart attack. Also counseled patient about the importance of medication adherence. If you participate in smoking, it is important to stop using tobacco as this will increase the risks associated with uncontrolled blood pressure.  Goal BP:  For patients younger than 60: Goal BP < 130/80. For patients 60 and older: Goal BP < 140/90. For patients with diabetes: Goal BP < 130/80.  Tracey Ramos  Minimize salt intake. Minimize alcohol intake Treatment for elevated Bp.hydralazine  50mg     This note has been created with Education officer, environmental. Any transcriptional errors are unintentional.   Tracey SHAUNNA Bohr, Tracey Ramos 10/11/2024, 10:50 AM

## 2024-10-11 NOTE — Patient Instructions (Signed)
 Hypertension, Adult Hypertension is another name for high blood pressure. High blood pressure forces your heart to work harder to pump blood. This can cause problems over time. There are two numbers in a blood pressure reading. There is a top number (systolic) over a bottom number (diastolic). It is best to have a blood pressure that is below 120/80. What are the causes? The cause of this condition is not known. Some other conditions can lead to high blood pressure. What increases the risk? Some lifestyle factors can make you more likely to develop high blood pressure: Smoking. Not getting enough exercise or physical activity. Being overweight. Having too much fat, sugar, calories, or salt (sodium) in your diet. Drinking too much alcohol. Other risk factors include: Having any of these conditions: Heart disease. Diabetes. High cholesterol. Kidney disease. Obstructive sleep apnea. Having a family history of high blood pressure and high cholesterol. Age. The risk increases with age. Stress. What are the signs or symptoms? High blood pressure may not cause symptoms. Very high blood pressure (hypertensive crisis) may cause: Headache. Fast or uneven heartbeats (palpitations). Shortness of breath. Nosebleed. Vomiting or feeling like you may vomit (nauseous). Changes in how you see. Very bad chest pain. Feeling dizzy. Seizures. How is this treated? This condition is treated by making healthy lifestyle changes, such as: Eating healthy foods. Exercising more. Drinking less alcohol. Your doctor may prescribe medicine if lifestyle changes do not help enough and if: Your top number is above 130. Your bottom number is above 80. Your personal target blood pressure may vary. Follow these instructions at home: Eating and drinking  If told, follow the DASH eating plan. To follow this plan: Fill one half of your plate at each meal with fruits and vegetables. Fill one fourth of your plate  at each meal with whole grains. Whole grains include whole-wheat pasta, brown rice, and whole-grain bread. Eat or drink low-fat dairy products, such as skim milk or low-fat yogurt. Fill one fourth of your plate at each meal with low-fat (lean) proteins. Low-fat proteins include fish, chicken without skin, eggs, beans, and tofu. Avoid fatty meat, cured and processed meat, or chicken with skin. Avoid pre-made or processed food. Limit the amount of salt in your diet to less than 1,500 mg each day. Do not drink alcohol if: Your doctor tells you not to drink. You are pregnant, may be pregnant, or are planning to become pregnant. If you drink alcohol: Limit how much you have to: 0-1 drink a day for women. 0-2 drinks a day for men. Know how much alcohol is in your drink. In the [place] one drink equals one 12 oz bottle of beer (355 mL), one 5 oz glass of wine (148 mL), or one 1 oz glass of hard liquor (44 mL). Lifestyle  Work with your doctor to stay at a healthy weight or to lose weight. Ask your doctor what the best weight is for you. Get at least 30 minutes of exercise that causes your heart to beat faster (aerobic exercise) most days of the week. This may include walking, swimming, or biking. Get at least 30 minutes of exercise that strengthens your muscles (resistance exercise) at least 3 days a week. This may include lifting weights or doing Pilates. Do not smoke or use any products that contain nicotine or tobacco. If you need help quitting, ask your doctor. Check your blood pressure at home as told by your doctor. Keep all follow-up visits. Medicines Take over-the-counter and prescription medicines  only as told by your doctor. Follow directions carefully. Do not skip doses of blood pressure medicine. The medicine does not work as well if you skip doses. Skipping doses also puts you at risk for problems. Ask your doctor about side effects or reactions to medicines that you should watch  for. Contact a doctor if: You think you are having a reaction to the medicine you are taking. You have headaches that keep coming back. You feel dizzy. You have swelling in your ankles. You have trouble with your vision. Get help right away if: You get a very bad headache. You start to feel mixed up (confused). You feel weak or numb. You feel faint. You have very bad pain in your: Chest. Belly (abdomen). You vomit more than once. You have trouble breathing. These symptoms may be an emergency. Get help right away. Call 911. Do not wait to see if the symptoms will go away. Do not drive yourself to the hospital. Summary Hypertension is another name for high blood pressure. High blood pressure forces your heart to work harder to pump blood. For most people, a normal blood pressure is less than 120/80. Making healthy choices can help lower blood pressure. If your blood pressure does not get lower with healthy choices, you may need to take medicine. This information is not intended to replace advice given to you by your health care provider. Make sure you discuss any questions you have with your health care provider. Document Revised: 10/02/2021 Document Reviewed: 10/02/2021 Elsevier Patient Education  2024 [employer].

## 2024-10-24 ENCOUNTER — Encounter (INDEPENDENT_AMBULATORY_CARE_PROVIDER_SITE_OTHER)

## 2024-10-25 ENCOUNTER — Ambulatory Visit (INDEPENDENT_AMBULATORY_CARE_PROVIDER_SITE_OTHER): Admitting: Primary Care

## 2024-10-26 ENCOUNTER — Ambulatory Visit (INDEPENDENT_AMBULATORY_CARE_PROVIDER_SITE_OTHER): Admitting: Primary Care

## 2024-11-28 ENCOUNTER — Ambulatory Visit (INDEPENDENT_AMBULATORY_CARE_PROVIDER_SITE_OTHER): Admitting: Primary Care

## 2024-12-25 ENCOUNTER — Telehealth (INDEPENDENT_AMBULATORY_CARE_PROVIDER_SITE_OTHER): Payer: Self-pay | Admitting: Primary Care

## 2024-12-25 NOTE — Telephone Encounter (Signed)
 Called pt to confirm appt. Pt phone number is unavailable.

## 2024-12-26 ENCOUNTER — Ambulatory Visit (INDEPENDENT_AMBULATORY_CARE_PROVIDER_SITE_OTHER): Admitting: Primary Care

## 2024-12-26 ENCOUNTER — Encounter (INDEPENDENT_AMBULATORY_CARE_PROVIDER_SITE_OTHER): Payer: Self-pay | Admitting: Primary Care

## 2024-12-26 VITALS — BP 132/78 | HR 118 | Resp 16 | Wt 201.0 lb

## 2024-12-26 DIAGNOSIS — I10 Essential (primary) hypertension: Secondary | ICD-10-CM | POA: Diagnosis not present

## 2024-12-26 DIAGNOSIS — Z0130 Encounter for examination of blood pressure without abnormal findings: Secondary | ICD-10-CM

## 2024-12-26 NOTE — Progress Notes (Signed)
 " [facility]   Tracey Ramos is a 68 y.o. female presents for hypertension evaluation, Denies shortness of breath, headaches, chest pain or lower extremity edema, sudden onset, vision changes, unilateral weakness, dizziness, paresthesias   Patient reports adherence with medications.   Past Medical History:  Diagnosis Date   Cholelithiasis    CKD (chronic kidney disease), stage II    Hernia of abdominal wall    Hypertension    Marijuana abuse    Past Surgical History:  Procedure Laterality Date   APPENDECTOMY     Allergies[1] Medications Ordered Prior to Encounter[2] Social History   Socioeconomic History   Marital status: Single    Spouse name: Not on file   Number of children: Not on file   Years of education: Not on file   Highest education level: Not on file  Occupational History   Not on file  Tobacco Use   Smoking status: Former    Types: Cigarettes   Smokeless tobacco: Never  Vaping Use   Vaping status: Never Used  Substance and Sexual Activity   Alcohol use: Yes   Drug use: Not Currently    Types: Marijuana   Sexual activity: Not Currently  Other Topics Concern   Not on file  Social History Narrative   Not on file   Social Drivers of Health   Tobacco Use: Medium Risk (12/26/2024)   Patient History    Smoking Tobacco Use: Former    Smokeless Tobacco Use: Never    Passive Exposure: Not on Actuary Strain: Not on file  Food Insecurity: No Food Insecurity (05/05/2024)   Hunger Vital Sign    Worried About Running Out of Food in the Last Year: Never true    Ran Out of Food in the Last Year: Never true  Transportation Needs: No Transportation Needs (05/05/2024)   PRAPARE - Administrator, Civil Service (Medical): No    Lack of Transportation (Non-Medical): No  Physical Activity: Not on file  Stress: Not on file  Social Connections: Moderately Integrated (05/05/2024)   Social Connection and Isolation Panel     Frequency of Communication with Friends and Family: Twice a week    Frequency of Social Gatherings with Friends and Family: Three times a week    Attends Religious Services: 1 to 4 times per year    Active Member of Clubs or Organizations: Yes    Attends Banker Meetings: 1 to 4 times per year    Marital Status: Never married  Intimate Partner Violence: Not At Risk (05/05/2024)   Humiliation, Afraid, Rape, and Kick questionnaire    Fear of Current or Ex-Partner: No    Emotionally Abused: No    Physically Abused: No    Sexually Abused: No  Depression (PHQ2-9): Low Risk (10/11/2024)   Depression (PHQ2-9)    PHQ-2 Score: 0  Recent Concern: Depression (PHQ2-9) - Medium Risk (08/07/2024)   Depression (PHQ2-9)    PHQ-2 Score: 9  Alcohol Screen: Low Risk (05/05/2024)   Alcohol Screen    Last Alcohol Screening Score (AUDIT): 0  Housing: Low Risk (05/05/2024)   Housing Stability Vital Sign    Unable to Pay for Housing in the Last Year: No    Number of Times Moved in the Last Year: 0    Homeless in the Last Year: No  Utilities: Not At Risk (05/05/2024)   AHC Utilities    Threatened with loss of utilities: No  Health Literacy: Not  on file   Family History  Problem Relation Age of Onset   Depression Mother    Hypertension Father    Heart attack Father    Health Maintenance  Topic Date Due   Medicare Annual Wellness (AWV)  Never done   COVID-19 Vaccine (1 - 2025-26 season) Never done   Zoster Vaccines- Shingrix (1 of 2) 01/11/2025 (Originally 04/02/2006)   Influenza Vaccine  03/27/2025 (Originally 07/28/2024)   Pneumococcal Vaccine: 50+ Years (1 of 1 - PCV) 10/11/2025 (Originally 04/02/2006)   Mammogram  03/08/2025   Fecal DNA (Cologuard)  02/24/2026   DTaP/Tdap/Td (2 - Td or Tdap) 04/26/2028   Bone Density Scan  Completed   Hepatitis C Screening  Completed   Meningococcal B Vaccine  Aged Out     OBJECTIVE:  Vitals:   12/26/24 1125  BP: 132/78  Pulse: (!) 118  Resp: 16   SpO2: 97%  Weight: 201 lb (91.2 kg)    Physical Exam Vitals reviewed.  Constitutional:      Appearance: Normal appearance. She is obese.  HENT:     Head: Normocephalic.     Right Ear: External ear normal.     Left Ear: External ear normal.     Nose: Nose normal.     Mouth/Throat:     Mouth: Mucous membranes are moist.  Eyes:     Extraocular Movements: Extraocular movements intact.     Pupils: Pupils are equal, round, and reactive to light.  Cardiovascular:     Rate and Rhythm: Normal rate.  Pulmonary:     Effort: Pulmonary effort is normal.     Breath sounds: Normal breath sounds.  Abdominal:     General: Bowel sounds are normal.     Palpations: Abdomen is soft.  Musculoskeletal:        General: Normal range of motion.     Cervical back: Normal range of motion.  Skin:    General: Skin is warm and dry.  Neurological:     Mental Status: She is alert and oriented to person, place, and time.  Psychiatric:        Mood and Affect: Mood normal.        Behavior: Behavior normal.        Thought Content: Thought content normal.      ROS  Last 3 Office BP readings: BP Readings from Last 3 Encounters:  12/26/24 132/78  10/11/24 136/82  08/15/24 135/89    BMET    Component Value Date/Time   NA 143 08/14/2024 2305   NA 141 02/11/2023 1152   K 3.3 (L) 08/14/2024 2305   CL 106 08/14/2024 2305   CO2 23 08/14/2024 2305   GLUCOSE 110 (H) 08/14/2024 2305   BUN 13 08/14/2024 2305   BUN 12 02/11/2023 1152   CREATININE 1.25 (H) 08/14/2024 2305   CALCIUM  9.5 08/14/2024 2305   GFRNONAA 47 (L) 08/14/2024 2305   GFRAA 61 03/25/2018 1433    Renal function: CrCl cannot be calculated (Patient's most recent lab result is older than the maximum 21 days allowed.).  Clinical ASCVD: No  The 10-year ASCVD risk score (Arnett DK, et al., 2019) is: 8%   Values used to calculate the score:     Age: 7 years     Clinically relevant sex: Female     Is Non-Hispanic African American:  Yes     Diabetic: No     Tobacco smoker: No     Systolic Blood Pressure: 132 mmHg  Is BP treated: Yes     HDL Cholesterol: 54 mg/dL     Total Cholesterol: 133 mg/dL  ASCVD risk factors include- CHAD   ASSESSMENT & PLAN:   Tracey Ramos was seen today for blood pressure check.  Diagnoses and all orders for this visit:  BP check 22 Primary hypertension  -Counseled on lifestyle modifications for blood pressure control including reduced dietary sodium, increased exercise, weight reduction and adequate sleep. Also, educated patient about the risk for cardiovascular events, stroke and heart attack. Also counseled patient about the importance of medication adherence. If you participate in smoking, it is important to stop using tobacco as this will increase the risks associated with uncontrolled blood pressure.   -Goal BP:  For patients younger than 60: Goal BP < 130/80. For patients 60 and older: Goal BP < 140/90. For patients with diabetes: Goal BP < 130/80. Your most recent BP: 132/78  Minimize salt intake. Minimize alcohol intake    This note has been created with Education officer, environmental. Any transcriptional errors are unintentional.   Rosaline SHAUNNA Bohr, NP 12/26/2024, 11:45 AM       [1]  Allergies Allergen Reactions   Penicillins Swelling  [2]  Current Outpatient Medications on File Prior to Visit  Medication Sig Dispense Refill   amLODipine  (NORVASC ) 10 MG tablet Take 1 tablet (10 mg total) by mouth daily. 90 tablet 1   OLANZapine  (ZYPREXA ) 15 MG tablet Take 1 tablet (15 mg total) by mouth at bedtime. 30 tablet 0   rosuvastatin  (CRESTOR ) 40 MG tablet Take 1 tablet (40 mg total) by mouth daily. 30 tablet 0   No current facility-administered medications on file prior to visit.   "

## 2025-01-11 ENCOUNTER — Other Ambulatory Visit: Payer: Self-pay | Admitting: Pharmacist

## 2025-01-11 MED ORDER — ROSUVASTATIN CALCIUM 40 MG PO TABS: 40.0000 mg | ORAL_TABLET | Freq: Every day | ORAL | 11 refills | Status: DC

## 2025-01-11 NOTE — Progress Notes (Signed)
 Pharmacy Quality Measure Review  This patient is appearing on a report for being at risk of failing the adherence measure for cholesterol (statin) medications this calendar year.   Medication: rosuvastatin  Last fill date: 05/11/2024 for 30 day supply  Reviewed chart. Upon review, it looks like there were no refills sent since filling in May of 2025. I sent in 1 year's worth of fills and will make sure pharmacy received this next week.  Herlene Fleeta Morris, PharmD, JAQUELINE, CPP Clinical Pharmacist [facility] & [facility] [phone]

## 2025-01-17 ENCOUNTER — Other Ambulatory Visit: Payer: Self-pay | Admitting: Pharmacist

## 2025-01-17 NOTE — Progress Notes (Signed)
 Pharmacy Quality Measure Review  This patient is appearing on a report for being at risk of failing the adherence measure for cholesterol (statin) medications this calendar year.   Medication: rosuvastatin  Last fill date: 05/11/2024 for 30 day supply  Refills sent last week for her. She gets pill packs sent out monthly by [facility]. Called and confirmed that they are sending pill packs on 01/24/2025.  Herlene Fleeta Morris, PharmD, JAQUELINE, CPP Clinical Pharmacist [facility] & [facility] [phone]

## 2025-01-23 ENCOUNTER — Ambulatory Visit (INDEPENDENT_AMBULATORY_CARE_PROVIDER_SITE_OTHER): Payer: Self-pay

## 2025-01-31 ENCOUNTER — Other Ambulatory Visit: Payer: Self-pay | Admitting: Pharmacist

## 2025-01-31 MED ORDER — ROSUVASTATIN CALCIUM 40 MG PO TABS: 40.0000 mg | ORAL_TABLET | Freq: Every day | ORAL | 11 refills | Status: AC

## 2025-01-31 NOTE — Progress Notes (Signed)
 Pharmacy Quality Measure Review  This patient is appearing on a report for being at risk of failing the adherence measure for cholesterol (statin) medications this calendar year.   Medication: rosuvastatin  Last fill date: 05/11/2024 for 30 day supply  I sent refills to [facility] at the end of last month but she now uses [facility] mail delivery. Rxn resent to them this morning. Reminder set to make sure this was delivered next week.   Tracey Ramos, PharmD, JAQUELINE, CPP Clinical Pharmacist [facility] & [facility] [phone]
